# Patient Record
Sex: Male | Born: 1980 | Hispanic: Yes | Marital: Married | Smoking: Never smoker
Health system: Southern US, Community
[De-identification: ages and names within clinical notes are randomized; demographics above are authoritative.]

## PROBLEM LIST (undated history)

## (undated) DIAGNOSIS — E782 Mixed hyperlipidemia: Secondary | ICD-10-CM

## (undated) DIAGNOSIS — F5105 Insomnia due to other mental disorder: Secondary | ICD-10-CM

## (undated) DIAGNOSIS — F411 Generalized anxiety disorder: Secondary | ICD-10-CM

## (undated) DIAGNOSIS — F319 Bipolar disorder, unspecified: Secondary | ICD-10-CM

## (undated) DIAGNOSIS — R4789 Other speech disturbances: Secondary | ICD-10-CM

## (undated) HISTORY — DX: Mixed hyperlipidemia: E78.2

## (undated) HISTORY — DX: Generalized anxiety disorder: F41.1

## (undated) HISTORY — DX: Bipolar disorder, unspecified: F31.9

## (undated) HISTORY — DX: Other speech disturbances: R47.89

## (undated) HISTORY — PX: NO PAST SURGERIES: SHX2092

## (undated) HISTORY — DX: Insomnia due to other mental disorder: F51.05

---

## 2016-07-14 ENCOUNTER — Emergency Department (HOSPITAL_COMMUNITY)
Admission: EM | Admit: 2016-07-14 | Discharge: 2016-07-14 | Disposition: A | Discharge status: Home / Self Care | Payer: Self-pay | Attending: Emergency Medicine | Admitting: Emergency Medicine

## 2016-07-14 ENCOUNTER — Encounter (HOSPITAL_COMMUNITY): Payer: Self-pay | Admitting: Emergency Medicine

## 2016-07-14 DIAGNOSIS — Y929 Unspecified place or not applicable: Secondary | ICD-10-CM | POA: Insufficient documentation

## 2016-07-14 DIAGNOSIS — Y999 Unspecified external cause status: Secondary | ICD-10-CM | POA: Insufficient documentation

## 2016-07-14 DIAGNOSIS — W228XXA Striking against or struck by other objects, initial encounter: Secondary | ICD-10-CM | POA: Insufficient documentation

## 2016-07-14 DIAGNOSIS — Y9389 Activity, other specified: Secondary | ICD-10-CM | POA: Insufficient documentation

## 2016-07-14 DIAGNOSIS — IMO0002 Reserved for concepts with insufficient information to code with codable children: Secondary | ICD-10-CM

## 2016-07-14 DIAGNOSIS — W268XXA Contact with other sharp object(s), not elsewhere classified, initial encounter: Secondary | ICD-10-CM | POA: Insufficient documentation

## 2016-07-14 DIAGNOSIS — S61316A Laceration without foreign body of right little finger with damage to nail, initial encounter: Secondary | ICD-10-CM | POA: Insufficient documentation

## 2016-07-14 MED ORDER — LIDOCAINE HCL (PF) 1 % IJ SOLN: 5.0000 mL | Freq: Once | INTRAMUSCULAR | Status: DC

## 2016-07-14 MED ORDER — CEPHALEXIN 500 MG PO CAPS: 500.0000 mg | ORAL_CAPSULE | Freq: Two times a day (BID) | ORAL | 0 refills | Status: DC

## 2016-07-14 MED ORDER — LIDOCAINE HCL 1 % IJ SOLN
5.0000 mL | Freq: Once | INTRAMUSCULAR | Status: AC
  Administered 2016-07-14: 5 mL
  Filled 2016-07-14: qty 20

## 2016-07-14 NOTE — ED Triage Notes (Signed)
Patient presents with laceration to right pinky having cutting it at work. Bleeding controlled at this time.

## 2016-07-14 NOTE — ED Provider Notes (Signed)
[facility]-EMERGENCY DEPT Provider Note   CSN: [id number] Arrival date & time: 07/14/16  1310  First Provider Contact:  None    By signing my name below, I, Matthew Wilcox, attest that this documentation has been prepared under the direction and in the presence of non-physician practitioner, Audry Pili, PA-C. Electronically Signed: Freida Wilcox, Scribe. 07/14/2016. 1:48 PM.   History   Chief Complaint No chief complaint on file.   The history is provided by the patient. No language interpreter was used.   HPI Comments:  Matthew Wilcox is a 35 y.o. male who presents to the Emergency Department complaining of an injury to the tip of the right pinky finger which occurred ~ 0900 this AM. Bleeding controlled at this time. He denies pain at the site. Pt states he hit the edge of a metal mud pain injuring the finger. Tetanus is UTD within the last 2-3 months. Pt has no other complaints or symptoms at this time.   No past medical history on file.  There are no active problems to display for this patient.   No past surgical history on file.   Home Medications    Prior to Admission medications   Not on File    Family History No family history on file.  Social History Social History  Substance Use Topics  . Smoking status: Not on file  . Smokeless tobacco: Not on file  . Alcohol use Not on file    Allergies   Review of patient's allergies indicates not on file.   Review of Systems Review of Systems  Constitutional: Negative for fever.  Skin: Positive for wound.  Neurological: Negative for weakness.   Physical Exam Updated Vital Signs There were no vitals taken for this visit.  Physical Exam  Constitutional: He is oriented to person, place, and time. He appears well-developed and well-nourished. No distress.  HENT:  Head: Normocephalic and atraumatic.  Eyes: Conjunctivae are normal.  Cardiovascular: Normal rate.   Pulmonary/Chest: Effort normal.  Musculoskeletal:  Normal range of motion.  Neurological: He is alert and oriented to person, place, and time.  Skin: Skin is warm and dry. Capillary refill takes less than 2 seconds.  Right little finger: ROM intact; cap refill less than 2 sec nontender to palpation  Sensation intact. Nail avulsed from bed with matrix underneath intact. No lacerations noted. Bleeding controlled.    Psychiatric: He has a normal mood and affect.  Nursing note and vitals reviewed.  ED Treatments / Results  DIAGNOSTIC STUDIES:  COORDINATION OF CARE:  1:48 PM Discussed treatment plan with pt at bedside and pt agreed to plan.  Labs (all labs ordered are listed, but only abnormal results are displayed) Labs Reviewed - No data to display  EKG  EKG Interpretation None      Radiology No results found.  Procedures .Marland KitchenLaceration Repair Date/Time: 07/14/2016 3:18 PM Performed by: Audry Pili Authorized by: Audry Pili   Consent:    Consent obtained:  Verbal   Consent given by:  Patient Laceration details:    Location:  Finger   Finger location:  R small finger Treatment:    Area cleansed with:  Betadine   Amount of cleaning:  Standard   Irrigation solution:  Sterile saline   Irrigation method:  Syringe   Visualized foreign bodies/material removed: yes   Skin repair:    Repair method:  Tissue adhesive Approximation:    Approximation:  Close   Vermilion border: well-aligned   Post-procedure details:  Dressing:  Non-adherent dressing and antibiotic ointment   Patient tolerance of procedure:  Tolerated well, no immediate complications Comments:     Pt with nail avulsion that was re approximated. No underlying matrix damage. Sealed with Dermabond. No lacerations for suture repair. No joint involvement   .Nerve Block Date/Time: 07/14/2016 3:20 PM Performed by: Audry Pili Authorized by: Audry Pili   Consent:    Consent obtained:  Verbal   Consent given by:  Patient Location:    Body area:  Upper  extremity   Upper extremity nerve:  Metacarpal   Laterality:  Right Pre-procedure details:    Skin preparation:  2% chlorhexidine Procedure details (see MAR for exact dosages):    Block needle gauge:  24 G   Anesthetic injected:  Lidocaine 1% w/o epi   Steroid injected:  None   Additive injected:  None Post-procedure details:    Dressing:  Sterile dressing   Outcome:  Anesthesia achieved   Patient tolerance of procedure:  Tolerated well, no immediate complications   (including critical care time)  Medications Ordered in ED Medications - No data to display   Initial Impression / Assessment and Plan / ED Course  I have reviewed the triage vital signs and the nursing notes.  Pertinent labs & imaging results that were available during my care of the patient were reviewed by me and considered in my medical decision making (see chart for details).  Clinical Course   Final Clinical Impressions(s) / ED Diagnoses  I have reviewed the relevant previous healthcare records. I obtained HPI from historian.  ED Course:  Assessment: Pt is a 34yM who presents with avulsed nail from right little finger around 9am. On exam, pt in NAD. Nontoxic/nonseptic appearing. VSS. Afebrile. Nail avulsed with no laceration on matrix. Performed digit block and reinserted nail with dermabond. Dressed. Area non TTP. Full ROM. Neurovascularly intact. Plan is to DC home with follow up to PCP. Given ABX. At time of discharge, Patient is in no acute distress. Vital Signs are stable. Patient is able to ambulate. Patient able to tolerate PO.    Disposition/Plan:  DC Home Additional Verbal discharge instructions given and discussed with patient.  Pt Instructed to f/u with PCP in the next week for evaluation and treatment of symptoms. Return precautions given Pt acknowledges and agrees with plan  Supervising Physician Doug Sou, MD   Final diagnoses:  Nail avulsion    New Prescriptions New Prescriptions     No medications on file   I personally performed the services described in this documentation, which was scribed in my presence. The recorded information has been reviewed and is accurate.    Audry Pili, PA-C 07/14/16 1521    Doug Sou, MD 07/14/16 [phone]

## 2016-07-14 NOTE — Discharge Instructions (Signed)
Please read and follow all provided instructions.  Your diagnoses today include:  1. Nail avulsion     Tests performed today include: Vital signs. See below for your results today.   Medications prescribed:  Take as prescribed   Home care instructions:  Follow any educational materials contained in this packet. DO NOT USE OIL ON THE AREA. NO NEOSPORIN  Follow-up instructions: Please follow-up with your primary care provider for further evaluation of symptoms and treatment   Return instructions:  Please return to the Emergency Department if you do not get better, if you get worse, or new symptoms OR  - Fever (temperature greater than 101.68F)  - Bleeding that does not stop with holding pressure to the area    -Severe pain (please note that you may be more sore the day after your accident)  - Chest Pain  - Difficulty breathing  - Severe nausea or vomiting  - Inability to tolerate food and liquids  - Passing out  - Skin becoming red around your wounds  - Change in mental status (confusion or lethargy)  - New numbness or weakness    Please return if you have any other emergent concerns.  Additional Information:  Your vital signs today were: BP 142/84 (BP Location: Left Arm)    Pulse 87    Temp 98.1 F (36.7 C) (Oral)    Resp 18    SpO2 100%  If your blood pressure (BP) was elevated above 135/85 this visit, please have this repeated by your doctor within one month. ---------------

## 2019-05-10 ENCOUNTER — Ambulatory Visit
Admission: RE | Admit: 2019-05-10 | Discharge: 2019-05-10 | Disposition: A | Discharge status: Home / Self Care | Payer: Self-pay | Source: Ambulatory Visit | Attending: Family Medicine | Admitting: Family Medicine

## 2019-05-10 ENCOUNTER — Other Ambulatory Visit: Payer: Self-pay | Admitting: Family Medicine

## 2019-05-10 DIAGNOSIS — R410 Disorientation, unspecified: Secondary | ICD-10-CM

## 2019-05-10 DIAGNOSIS — R42 Dizziness and giddiness: Secondary | ICD-10-CM

## 2020-01-22 IMAGING — CT CT HEAD WITHOUT CONTRAST
3 of 4 series · 15 of 47 positions shown, 18 images · non-contrast
Comparison: None.

CLINICAL DATA: Dizziness and giddiness  Confusion

EXAM:
CT HEAD WITHOUT CONTRAST
TECHNIQUE: Contiguous axial images were obtained from the base of the skull
through the vertex without intravenous contrast.

[Series 2: head 5.00 hr40 s3 ibhc · axial · 0.42mm/px · z∈[-599,-464]mm · 9 of 33 slices shown, 12 images]
[im 3/33  brain]
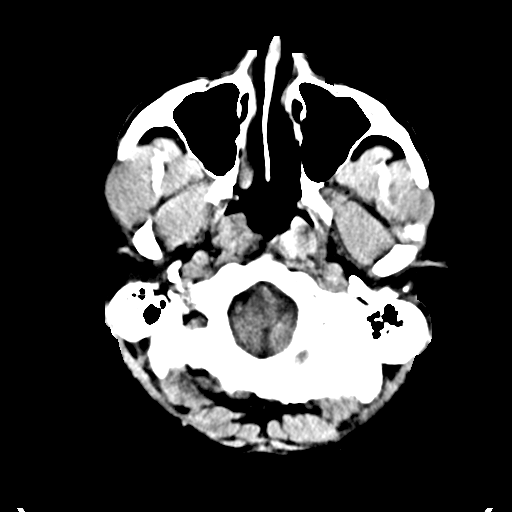
[im 3/33  bone]
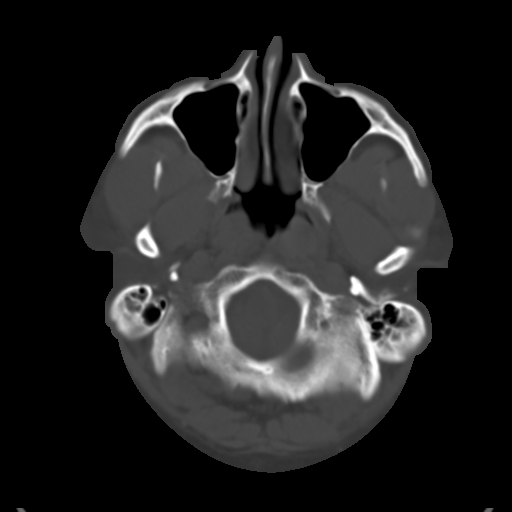
[im 7/33  brain]
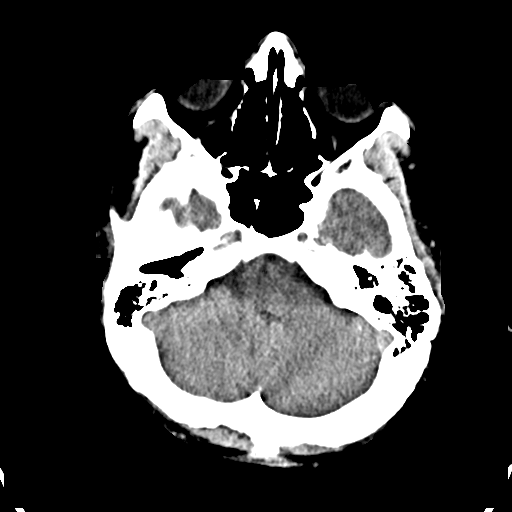
[im 10/33  brain]
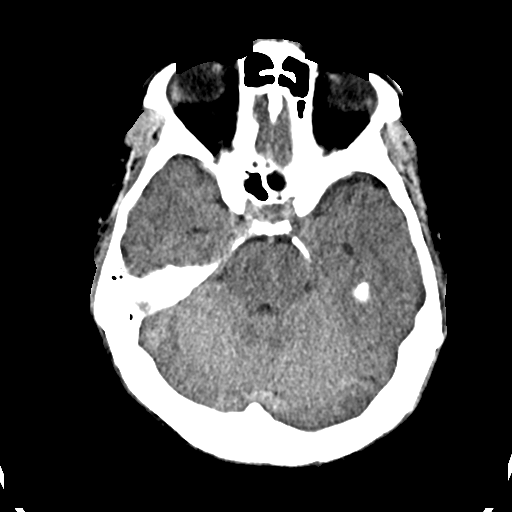
[im 14/33  brain]
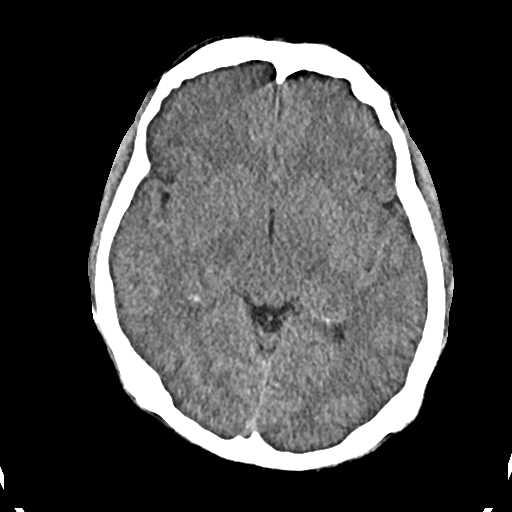
[im 17/33  brain]
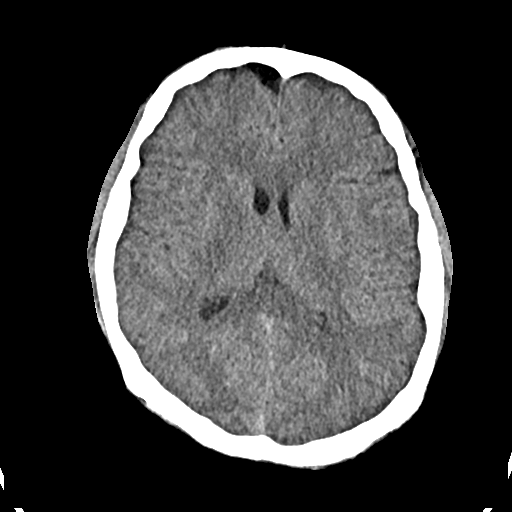
[im 17/33  bone]
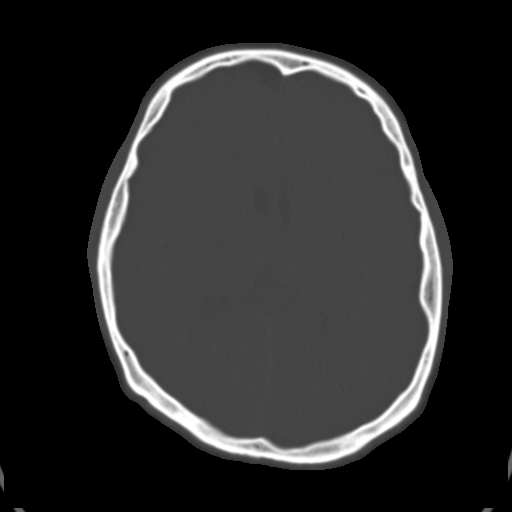
[im 19/33  brain]
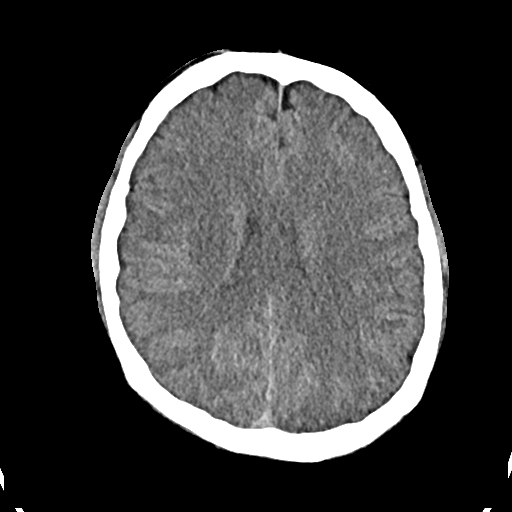
[im 23/33  brain]
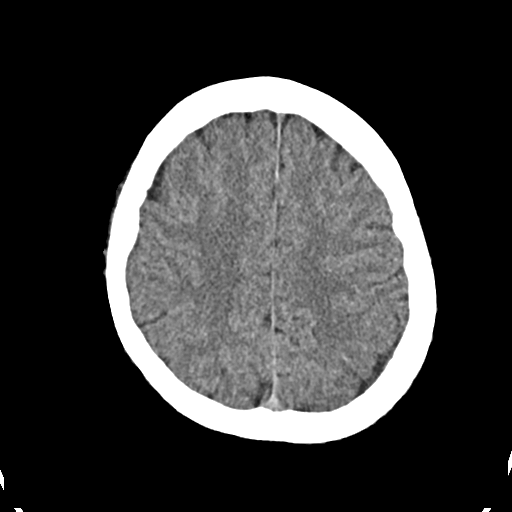
[im 26/33  brain]
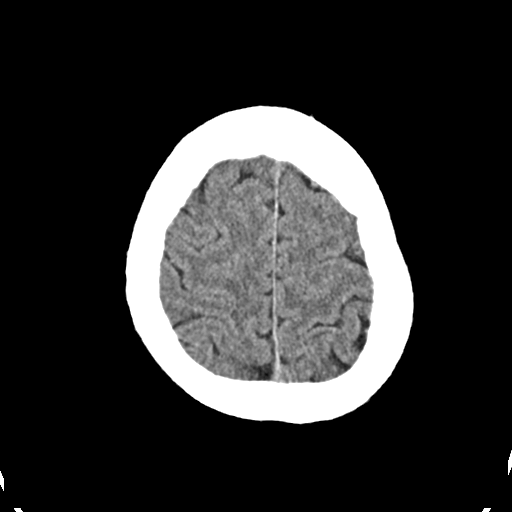
[im 30/33  brain]
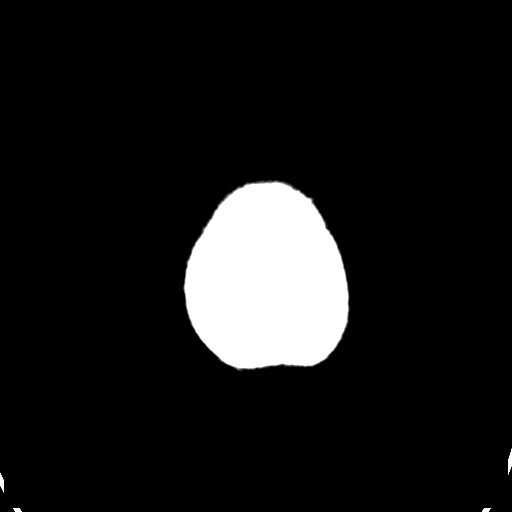
[im 30/33  bone]
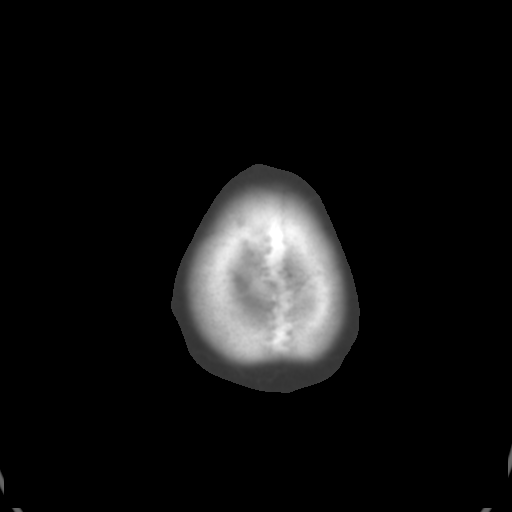

[Series 4: head 3.00 hr40 s3 sag · sagittal · 0.31mm/px · 3 of 64 slices shown]
[im 22/64  brain]
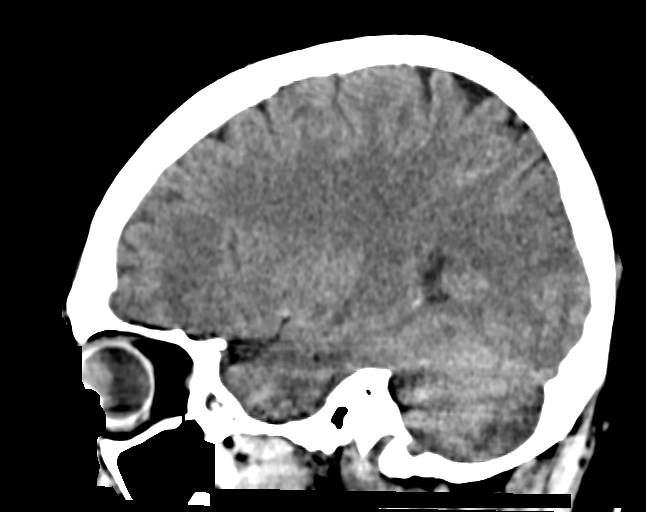
[im 32/64  brain]
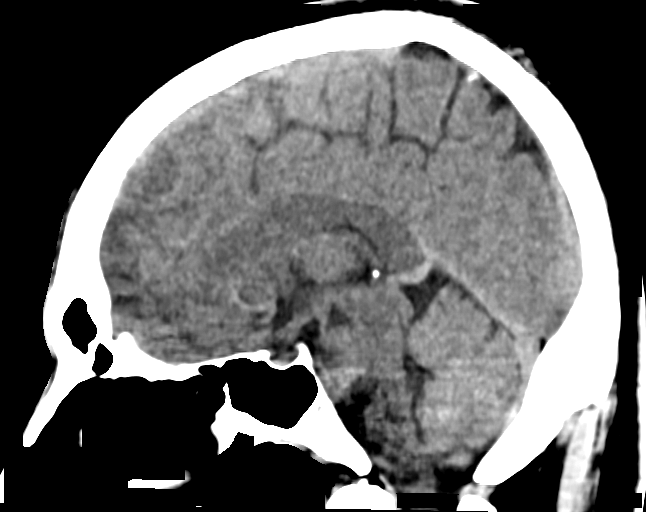
[im 43/64  brain]
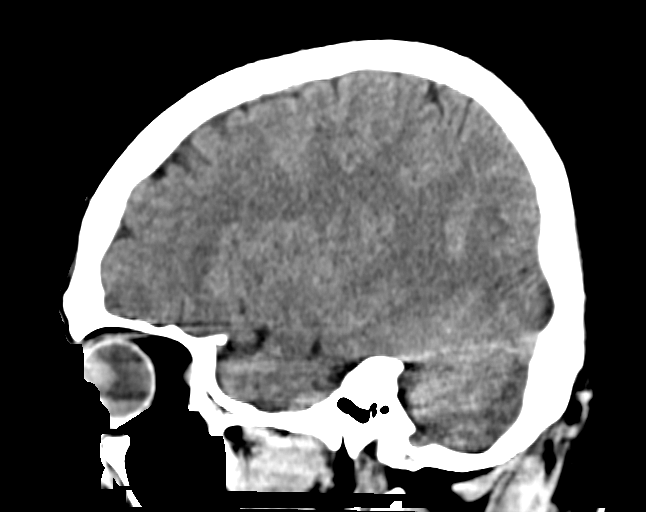

[Series 6: head 3.00 hr40 s3 cor · coronal · 0.31mm/px · 3 of 67 slices shown]
[im 23/67  brain]
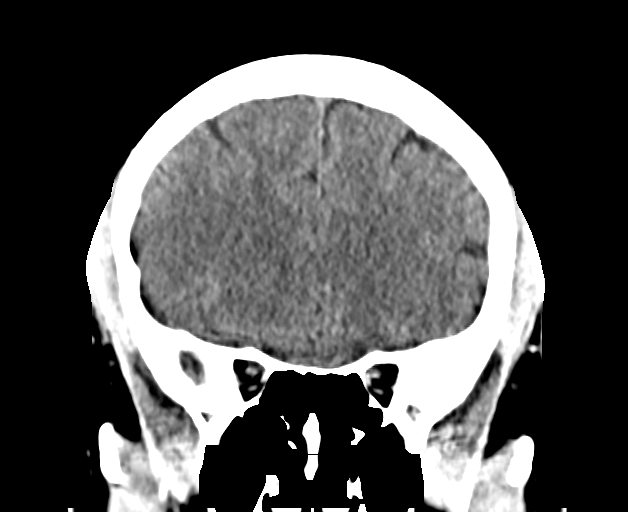
[im 30/67  brain]
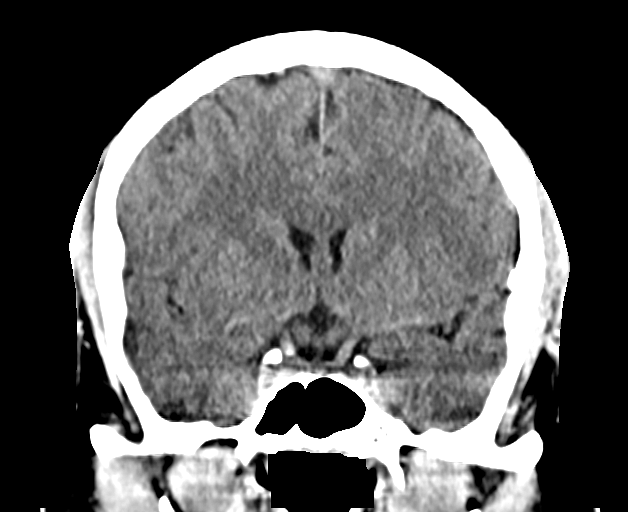
[im 37/67  brain]
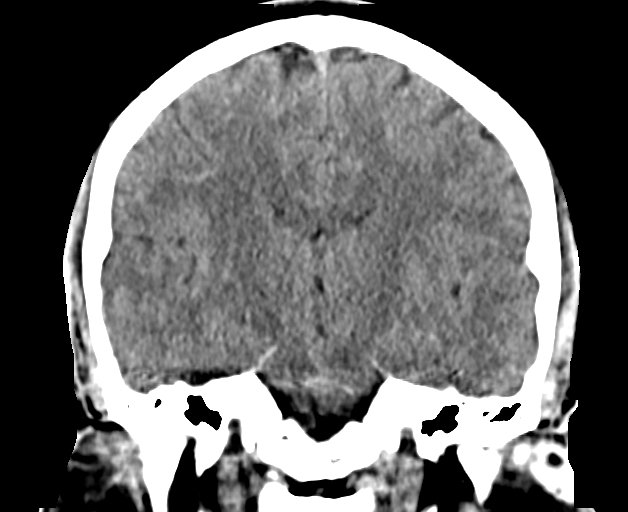

[15 of 47 positions shown; findings below may reference images not displayed]

FINDINGS: Brain: No evidence of acute infarction, hemorrhage, hydrocephalus,
extra-axial collection or mass lesion/mass effect.

Vascular: No hyperdense vessel or unexpected calcification.

Skull: No osseous abnormality.

Sinuses/Orbits: Visualized paranasal sinuses are clear. Visualized
mastoid sinuses are clear. Visualized orbits demonstrate no focal
abnormality.

Other: None
IMPRESSION: No acute intracranial pathology.

## 2020-02-23 ENCOUNTER — Other Ambulatory Visit: Payer: Self-pay

## 2020-02-24 ENCOUNTER — Other Ambulatory Visit: Payer: Self-pay | Admitting: Family Medicine

## 2020-02-24 MED ORDER — CARIPRAZINE HCL 3 MG PO CAPS: 3.0000 mg | ORAL_CAPSULE | Freq: Every day | ORAL | 1 refills | Status: DC

## 2020-02-24 MED ORDER — LORAZEPAM 0.5 MG PO TABS: 0.5000 mg | ORAL_TABLET | Freq: Every day | ORAL | 1 refills | Status: DC | PRN

## 2020-02-24 MED ORDER — LORAZEPAM 0.5 MG PO TABS: 0.5000 mg | ORAL_TABLET | Freq: Every day | ORAL | 0 refills | Status: DC | PRN

## 2020-02-24 MED ORDER — CARIPRAZINE HCL 3 MG PO CAPS: 3.0000 mg | ORAL_CAPSULE | Freq: Every day | ORAL | 11 refills | Status: DC

## 2020-02-28 ENCOUNTER — Other Ambulatory Visit: Payer: Self-pay | Admitting: Family Medicine

## 2020-02-28 MED ORDER — CARIPRAZINE HCL 3 MG PO CAPS: 3.0000 mg | ORAL_CAPSULE | Freq: Every day | ORAL | 1 refills | Status: DC

## 2020-03-05 ENCOUNTER — Telehealth: Payer: Self-pay

## 2020-03-05 NOTE — Telephone Encounter (Signed)
Matthew Wilcox called to report that Matthew Wilcox did not have a good week-end.  He was very sleepy and sluggish but has had no change in medication.  He is better today.  Matthew Wilcox is going to watch him closely and call for an appointment if he continues to feel sluggish.

## 2020-03-06 ENCOUNTER — Other Ambulatory Visit: Payer: Self-pay | Admitting: Family Medicine

## 2020-03-08 ENCOUNTER — Other Ambulatory Visit: Payer: Self-pay | Admitting: Family Medicine

## 2020-03-08 MED ORDER — LORAZEPAM 0.5 MG PO TABS: ORAL_TABLET | ORAL | 1 refills | Status: DC

## 2020-04-17 ENCOUNTER — Encounter: Payer: Self-pay | Admitting: Family Medicine

## 2020-04-17 DIAGNOSIS — E782 Mixed hyperlipidemia: Secondary | ICD-10-CM | POA: Insufficient documentation

## 2020-04-17 DIAGNOSIS — F5105 Insomnia due to other mental disorder: Secondary | ICD-10-CM | POA: Insufficient documentation

## 2020-04-17 NOTE — Progress Notes (Signed)
Subjective:  Patient ID: Matthew Wilcox, male    DOB: May 20, 1981  Age: 39 y.o. MRN: [id number]  Chief Complaint  Patient presents with  . Depression  . Hyperlipidemia  . Insomnia   HPI Patient to be evaluated for bipolar 1, major depressive disorder, recurrent, mild.  This is a routine follow-up.  The diagnosis of depression was made several years ago.  Presently, he feels a mild degree of depression.  Current medications include Vraylar 3 mg once daily. and lorazepam.  He denies any affective symptoms, including anhedonia, anxious mood, a change in appetite, altered sleep habits, crying spells, decreased ability to concentrate, fatigue, sadness and feelings of worthlessness.  Presently, Claudell denies suicidal ideation.  Previously tried paxil, prozac, lexapro, cymbalta, abilify and effexor. Wellbutrin did not help. He was able to get patient assistance for the vraylar. The weather change has helped also.     Pt presents with hyperlipidemia.  Current treatment includes Pravachol and a low cholesterol/low fat diet.  Compliance with treatment has been good; he maintains his exercise regimen.  He denies experiencing any hypercholesterolemia related symptoms.    Past Medical History:  Diagnosis Date  . Bipolar disorder ([place])   . GAD (generalized anxiety disorder)   . Insomnia due to mental disorder   . Mixed hyperlipidemia   . Other speech disturbances    No past surgical history on file.  Family History  Problem Relation Age of Onset  . Depression Mother   . Cancer Father        prostate  . Depression Father    Social History   Socioeconomic History  . Marital status: Married    Spouse name: Not on file  . Number of children: Not on file  . Years of education: Not on file  . Highest education level: Not on file  Occupational History  . Occupation: Painter  Tobacco Use  . Smoking status: Never Smoker  . Smokeless tobacco: Never Used  Substance and Sexual Activity  . Alcohol  use: Yes    Alcohol/week: 1.0 standard drinks    Types: 1 Cans of beer per week  . Drug use: No  . Sexual activity: Not on file  Other Topics Concern  . Not on file  Social History Narrative  . Not on file   Social Determinants of Health   Financial Resource Strain:   . Difficulty of Paying Living Expenses:   Food Insecurity:   . Worried About Charity fundraiser in the Last Year:   . Arboriculturist in the Last Year:   Transportation Needs:   . Film/video editor (Medical):   Marland Kitchen Lack of Transportation (Non-Medical):   Physical Activity:   . Days of Exercise per Week:   . Minutes of Exercise per Session:   Stress:   . Feeling of Stress :   Social Connections:   . Frequency of Communication with Friends and Family:   . Frequency of Social Gatherings with Friends and Family:   . Attends Religious Services:   . Active Member of Clubs or Organizations:   . Attends Archivist Meetings:   Marland Kitchen Marital Status:     Review of Systems  Constitutional: Negative for chills, diaphoresis, fatigue and fever.  HENT: Negative for congestion, ear pain and sore throat.   Respiratory: Negative for cough and shortness of breath.   Cardiovascular: Negative for chest pain and leg swelling.  Gastrointestinal: Negative for abdominal pain, constipation, diarrhea, nausea and vomiting.  Genitourinary: Negative for dysuria and urgency.  Musculoskeletal: Negative for arthralgias and myalgias.  Neurological: Negative for dizziness and headaches.  Psychiatric/Behavioral: Negative for dysphoric mood and sleep disturbance. The patient is not nervous/anxious.      Objective:  BP 110/74   Pulse 84   Temp (!) 97.1 F (36.2 C)   Resp 16   Ht 5\' 8"  (1.727 m)   Wt 189 lb 9.6 oz (86 kg)   BMI 28.83 kg/m   BP/Weight 04/19/2020 07/14/2016  Systolic BP 110 142  Diastolic BP 74 84  Wt. (Lbs) 189.6 -  BMI 28.83 -    Physical Exam Constitutional:      Appearance: Normal appearance.    Cardiovascular:     Rate and Rhythm: Normal rate and regular rhythm.  Pulmonary:     Effort: Pulmonary effort is normal.     Breath sounds: Normal breath sounds.  Abdominal:     General: Bowel sounds are normal.     Palpations: Abdomen is soft.     Tenderness: There is no abdominal tenderness.  Neurological:     Mental Status: He is alert and oriented to person, place, and time.  Psychiatric:        Mood and Affect: Mood normal.        Behavior: Behavior normal.     Assessment & Plan:  1. Mixed hyperlipidemia Improving. No changes to medicines.  Continue to work on eating a healthy diet and exercise.  Labs drawn today.  - Lipid panel - Comprehensive metabolic panel - CBC with Differential/Platelet  2. Insomnia due to mental disorder Improved.   3. Mild bipolar II disorder, most recent episode major depressive (HCC) The current medical regimen is effective;  continue present plan and medications.  Follow-up: Return in about 3 months (around 07/19/2020) for physical exam fasting.  An After Visit Summary was printed and given to the patient.  07/21/2020 [facility] [phone]

## 2020-04-19 ENCOUNTER — Other Ambulatory Visit: Payer: Self-pay

## 2020-04-19 ENCOUNTER — Encounter: Payer: Self-pay | Admitting: Family Medicine

## 2020-04-19 ENCOUNTER — Ambulatory Visit: Payer: 59 | Admitting: Family Medicine

## 2020-04-19 VITALS — BP 110/74 | HR 84 | Temp 97.1°F | Resp 16 | Ht 68.0 in | Wt 189.6 lb

## 2020-04-19 DIAGNOSIS — F3181 Bipolar II disorder: Secondary | ICD-10-CM | POA: Diagnosis not present

## 2020-04-19 DIAGNOSIS — F5105 Insomnia due to other mental disorder: Secondary | ICD-10-CM

## 2020-04-19 DIAGNOSIS — E782 Mixed hyperlipidemia: Secondary | ICD-10-CM

## 2020-04-20 LAB — LIPID PANEL
Chol/HDL Ratio: 4.9 ratio (ref 0.0–5.0)
Cholesterol, Total: 172 mg/dL (ref 100–199)
HDL: 35 mg/dL — ABNORMAL LOW (ref >39)
LDL Chol Calc (NIH): 91 mg/dL (ref 0–99)
Triglycerides: 273 mg/dL — ABNORMAL HIGH (ref 0–149)
VLDL Cholesterol Cal: 46 mg/dL — ABNORMAL HIGH (ref 5–40)

## 2020-04-20 LAB — COMPREHENSIVE METABOLIC PANEL
ALT: 36 IU/L (ref 0–44)
AST: 25 IU/L (ref 0–40)
Albumin/Globulin Ratio: 2.2 (ref 1.2–2.2)
Albumin: 4.8 g/dL (ref 4.0–5.0)
Alkaline Phosphatase: 84 IU/L (ref 39–117)
BUN/Creatinine Ratio: 11 (ref 9–20)
BUN: 11 mg/dL (ref 6–20)
Bilirubin Total: 0.8 mg/dL (ref 0.0–1.2)
CO2: 21 mmol/L (ref 20–29)
Calcium: 9.7 mg/dL (ref 8.7–10.2)
Chloride: 105 mmol/L (ref 96–106)
Creatinine, Ser: 0.96 mg/dL (ref 0.76–1.27)
GFR calc Af Amer: 116 mL/min/{1.73_m2} (ref >59)
GFR calc non Af Amer: 101 mL/min/{1.73_m2} (ref >59)
Globulin, Total: 2.2 g/dL (ref 1.5–4.5)
Glucose: 92 mg/dL (ref 65–99)
Potassium: 4.1 mmol/L (ref 3.5–5.2)
Sodium: 143 mmol/L (ref 134–144)
Total Protein: 7 g/dL (ref 6.0–8.5)

## 2020-04-20 LAB — CBC WITH DIFFERENTIAL/PLATELET
Basophils Absolute: 0 10*3/uL (ref 0.0–0.2)
Basos: 0 %
EOS (ABSOLUTE): 0.1 10*3/uL (ref 0.0–0.4)
Eos: 2 %
Hematocrit: 44.3 % (ref 37.5–51.0)
Hemoglobin: 15.2 g/dL (ref 13.0–17.7)
Immature Grans (Abs): 0 10*3/uL (ref 0.0–0.1)
Immature Granulocytes: 0 %
Lymphocytes Absolute: 2 10*3/uL (ref 0.7–3.1)
Lymphs: 38 %
MCH: 30 pg (ref 26.6–33.0)
MCHC: 34.3 g/dL (ref 31.5–35.7)
MCV: 88 fL (ref 79–97)
Monocytes Absolute: 0.3 10*3/uL (ref 0.1–0.9)
Monocytes: 6 %
Neutrophils Absolute: 3 10*3/uL (ref 1.4–7.0)
Neutrophils: 54 %
Platelets: 229 10*3/uL (ref 150–450)
RBC: 5.06 x10E6/uL (ref 4.14–5.80)
RDW: 13.1 % (ref 11.6–15.4)
WBC: 5.4 10*3/uL (ref 3.4–10.8)

## 2020-04-20 LAB — CARDIOVASCULAR RISK ASSESSMENT

## 2020-04-22 ENCOUNTER — Encounter: Payer: Self-pay | Admitting: Family Medicine

## 2020-04-30 ENCOUNTER — Other Ambulatory Visit: Payer: Self-pay | Admitting: Physician Assistant

## 2020-04-30 DIAGNOSIS — E782 Mixed hyperlipidemia: Secondary | ICD-10-CM

## 2020-04-30 MED ORDER — PRAVASTATIN SODIUM 40 MG PO TABS: 40.0000 mg | ORAL_TABLET | Freq: Every day | ORAL | 5 refills | Status: DC

## 2020-05-15 ENCOUNTER — Other Ambulatory Visit: Payer: Self-pay

## 2020-05-25 ENCOUNTER — Other Ambulatory Visit: Payer: Self-pay

## 2020-05-25 MED ORDER — CARIPRAZINE HCL 3 MG PO CAPS: 3.0000 mg | ORAL_CAPSULE | Freq: Every day | ORAL | 1 refills | Status: DC

## 2020-06-28 ENCOUNTER — Other Ambulatory Visit: Payer: Self-pay

## 2020-06-28 DIAGNOSIS — F3181 Bipolar II disorder: Secondary | ICD-10-CM

## 2020-07-12 ENCOUNTER — Other Ambulatory Visit: Payer: Self-pay | Admitting: Family Medicine

## 2020-07-12 DIAGNOSIS — F3181 Bipolar II disorder: Secondary | ICD-10-CM

## 2020-07-24 NOTE — Patient Instructions (Signed)
Preventive Care 38-38 Years Old, Male Preventive care refers to lifestyle choices and visits with your health care provider that can promote health and wellness. This includes:  A yearly physical exam. This is also called an annual well check.  Regular dental and eye exams.  Immunizations.  Screening for certain conditions.  Healthy lifestyle choices, such as eating a healthy diet, getting regular exercise, not using drugs or products that contain nicotine and tobacco, and limiting alcohol use. What can I expect for my preventive care visit? Physical exam Your health care provider will check:  Height and weight. These may be used to calculate body mass index (BMI), which is a measurement that tells if you are at a healthy weight.  Heart rate and blood pressure.  Your skin for abnormal spots. Counseling Your health care provider may ask you questions about:  Alcohol, tobacco, and drug use.  Emotional well-being.  Home and relationship well-being.  Sexual activity.  Eating habits.  Work and work Statistician. What immunizations do I need?  Influenza (flu) vaccine  This is recommended every year. Tetanus, diphtheria, and pertussis (Tdap) vaccine  You may need a Td booster every 10 years. Varicella (chickenpox) vaccine  You may need this vaccine if you have not already been vaccinated. Human papillomavirus (HPV) vaccine  If recommended by your health care provider, you may need three doses over 6 months. Measles, mumps, and rubella (MMR) vaccine  You may need at least one dose of MMR. You may also need a second dose. Meningococcal conjugate (MenACWY) vaccine  One dose is recommended if you are 38-13 years old and a Market researcher living in a residence hall, or if you have one of several medical conditions. You may also need additional booster doses. Pneumococcal conjugate (PCV13) vaccine  You may need this if you have certain conditions and were not  previously vaccinated. Pneumococcal polysaccharide (PPSV23) vaccine  You may need one or two doses if you smoke cigarettes or if you have certain conditions. Hepatitis A vaccine  You may need this if you have certain conditions or if you travel or work in places where you may be exposed to hepatitis A. Hepatitis B vaccine  You may need this if you have certain conditions or if you travel or work in places where you may be exposed to hepatitis B. Haemophilus influenzae type b (Hib) vaccine  You may need this if you have certain risk factors. You may receive vaccines as individual doses or as more than one vaccine together in one shot (combination vaccines). Talk with your health care provider about the risks and benefits of combination vaccines. What tests do I need? Blood tests  Lipid and cholesterol levels. These may be checked every 5 years starting at age 38.  Hepatitis C test.  Hepatitis B test. Screening   Diabetes screening. This is done by checking your blood sugar (glucose) after you have not eaten for a while (fasting).  Sexually transmitted disease (STD) testing. Talk with your health care provider about your test results, treatment options, and if necessary, the need for more tests. Follow these instructions at home: Eating and drinking   Eat a diet that includes fresh fruits and vegetables, whole grains, lean protein, and low-fat dairy products.  Take vitamin and mineral supplements as recommended by your health care provider.  Do not drink alcohol if your health care provider tells you not to drink.  If you drink alcohol: ? Limit how much you have to 38-2  drinks a day. ? Be aware of how much alcohol is in your drink. In the [place] one drink equals one 12 oz bottle of beer (355 mL), one 5 oz glass of wine (148 mL), or one 1 oz glass of hard liquor (44 mL). Lifestyle  Take daily care of your teeth and gums.  Stay active. Exercise for at least 30 minutes on 5 or  more days each week.  Do not use any products that contain nicotine or tobacco, such as cigarettes, e-cigarettes, and chewing tobacco. If you need help quitting, ask your health care provider.  If you are sexually active, practice safe sex. Use a condom or other form of protection to prevent STIs (sexually transmitted infections). What's next?  Go to your health care provider once a year for a well check visit.  Ask your health care provider how often you should have your eyes and teeth checked.  Stay up to date on all vaccines. This information is not intended to replace advice given to you by your health care provider. Make sure you discuss any questions you have with your health care provider. Document Revised: 12/02/2018 Document Reviewed: 12/02/2018 Elsevier Patient Education  2020 [employer].

## 2020-07-24 NOTE — Progress Notes (Signed)
No show

## 2020-07-25 ENCOUNTER — Ambulatory Visit (INDEPENDENT_AMBULATORY_CARE_PROVIDER_SITE_OTHER): Payer: 59 | Admitting: Family Medicine

## 2020-07-25 DIAGNOSIS — Z0000 Encounter for general adult medical examination without abnormal findings: Secondary | ICD-10-CM

## 2020-11-05 ENCOUNTER — Other Ambulatory Visit: Payer: Self-pay

## 2020-11-05 MED ORDER — CARIPRAZINE HCL 3 MG PO CAPS: 3.0000 mg | ORAL_CAPSULE | Freq: Every day | ORAL | 1 refills | Status: DC

## 2020-11-13 ENCOUNTER — Other Ambulatory Visit: Payer: Self-pay

## 2020-11-13 MED ORDER — CARIPRAZINE HCL 3 MG PO CAPS: 3.0000 mg | ORAL_CAPSULE | Freq: Every day | ORAL | 1 refills | Status: DC

## 2020-12-03 ENCOUNTER — Other Ambulatory Visit: Payer: Self-pay | Admitting: Physician Assistant

## 2020-12-03 DIAGNOSIS — E782 Mixed hyperlipidemia: Secondary | ICD-10-CM

## 2020-12-03 NOTE — Telephone Encounter (Signed)
Patient needs an appointment for refill. 

## 2020-12-31 ENCOUNTER — Other Ambulatory Visit: Payer: Self-pay | Admitting: Family Medicine

## 2020-12-31 MED ORDER — CARIPRAZINE HCL 3 MG PO CAPS: 3.0000 mg | ORAL_CAPSULE | Freq: Every day | ORAL | 0 refills | Status: DC

## 2021-01-11 ENCOUNTER — Other Ambulatory Visit: Payer: Self-pay

## 2021-01-11 MED ORDER — CARIPRAZINE HCL 3 MG PO CAPS: 3.0000 mg | ORAL_CAPSULE | Freq: Every day | ORAL | 0 refills | Status: DC

## 2021-03-18 ENCOUNTER — Other Ambulatory Visit: Payer: Self-pay | Admitting: Family Medicine

## 2021-03-18 DIAGNOSIS — E782 Mixed hyperlipidemia: Secondary | ICD-10-CM

## 2021-05-20 NOTE — Progress Notes (Signed)
Acute Office Visit  Subjective:    Patient ID: Matthew Wilcox, male    DOB: [birth year], 40 y.o.   MRN: [id number]  Chief Complaint  Patient presents with  . Nail Problem    HPI Patient is in today for toenail fungus which is affecting all toenails on both feet. Has had treatment in the past. States this has been going on for approx 1 year.  Pt has hyperlipidemia: on pravastatin. Is not fasting this morning.  Bipolar, Depression: on medications. Well controlle.d   Past Medical History:  Diagnosis Date  . Bipolar disorder (HCC)   . GAD (generalized anxiety disorder)   . Insomnia due to mental disorder   . Mixed hyperlipidemia   . Other speech disturbances     No past surgical history on file.  Family History  Problem Relation Age of Onset  . Depression Mother   . Cancer Father        prostate  . Depression Father     Social History   Socioeconomic History  . Marital status: Married    Spouse name: Not on file  . Number of children: Not on file  . Years of education: Not on file  . Highest education level: Not on file  Occupational History  . Occupation: Painter  Tobacco Use  . Smoking status: Never Smoker  . Smokeless tobacco: Never Used  Substance and Sexual Activity  . Alcohol use: Yes    Alcohol/week: 1.0 standard drink    Types: 1 Cans of beer per week  . Drug use: No  . Sexual activity: Not on file  Other Topics Concern  . Not on file  Social History Narrative  . Not on file   Social Determinants of Health   Financial Resource Strain: Not on file  Food Insecurity: Not on file  Transportation Needs: Not on file  Physical Activity: Not on file  Stress: Not on file  Social Connections: Not on file  Intimate Partner Violence: Not on file    Outpatient Medications Prior to Visit  Medication Sig Dispense Refill  . busPIRone (BUSPAR) 15 MG tablet Take 1 tablet by mouth twice daily 60 tablet 2  . cariprazine (VRAYLAR) capsule Take 1 capsule (3  mg total) by mouth daily. 90 capsule 0  . LORazepam (ATIVAN) 0.5 MG tablet TAKE 1 TABLET BY MOUTH ONCE DAILY AS NEEDED FOR ANXIETY /PANIC  ATTACK 30 tablet 1  . Omega-3 Fatty Acids (FISH OIL) 1000 MG CPDR Take 2,000 mg by mouth daily.    . pravastatin (PRAVACHOL) 40 MG tablet Take 1 tablet by mouth once daily 90 tablet 0   No facility-administered medications prior to visit.    Allergies  Allergen Reactions  . Abilify [Aripiprazole] Other (See Comments)    Weight gain     Review of Systems  Constitutional: Negative for chills, fatigue, fever and unexpected weight change.  HENT: Negative for congestion, ear pain, sinus pain and sore throat.   Cardiovascular: Negative for chest pain and palpitations.  Gastrointestinal: Negative for abdominal pain, blood in stool, constipation, diarrhea, nausea and vomiting.  Endocrine: Negative for polydipsia.  Genitourinary: Negative for dysuria.  Musculoskeletal: Positive for back pain.  Skin: Negative for rash.  Neurological: Negative for headaches.  Psychiatric/Behavioral: Negative for dysphoric mood. The patient is not nervous/anxious.        Objective:    Physical Exam Constitutional:      Appearance: Normal appearance.  Cardiovascular:     Rate and Rhythm: Normal  rate and regular rhythm.     Heart sounds: Normal heart sounds.  Pulmonary:     Effort: Pulmonary effort is normal.     Breath sounds: Normal breath sounds.  Abdominal:     General: Bowel sounds are normal.     Palpations: Abdomen is soft. There is no mass.     Tenderness: There is no abdominal tenderness.  Skin:    Comments: onychomysosis BL Gret toes.   Neurological:     Mental Status: He is alert.  Psychiatric:        Mood and Affect: Mood normal.        Behavior: Behavior normal.        Thought Content: Thought content normal.     BP 108/72   Pulse 94   Temp (!) 96.5 F (35.8 C)   Ht 5\' 8"  (1.727 m)   Wt 191 lb (86.6 kg)   SpO2 99%   BMI 29.04 kg/m  Wt  Readings from Last 3 Encounters:  05/21/21 191 lb (86.6 kg)  04/19/20 189 lb 9.6 oz (86 kg)    Health Maintenance Due  Topic Date Due  . HIV Screening  Never done  . Hepatitis C Screening  Never done  . TETANUS/TDAP  Never done    There are no preventive care reminders to display for this patient.   No results found for: TSH Lab Results  Component Value Date   WBC 5.4 04/19/2020   HGB 15.2 04/19/2020   HCT 44.3 04/19/2020   MCV 88 04/19/2020   PLT 229 04/19/2020   Lab Results  Component Value Date   NA 143 04/19/2020   K 4.1 04/19/2020   CO2 21 04/19/2020   GLUCOSE 92 04/19/2020   BUN 11 04/19/2020   CREATININE 0.96 04/19/2020   BILITOT 0.8 04/19/2020   ALKPHOS 84 04/19/2020   AST 25 04/19/2020   ALT 36 04/19/2020   PROT 7.0 04/19/2020   ALBUMIN 4.8 04/19/2020   CALCIUM 9.7 04/19/2020   Lab Results  Component Value Date   CHOL 172 04/19/2020   Lab Results  Component Value Date   HDL 35 (L) 04/19/2020   Lab Results  Component Value Date   LDLCALC 91 04/19/2020   Lab Results  Component Value Date   TRIG 273 (H) 04/19/2020   Lab Results  Component Value Date   CHOLHDL 4.9 04/19/2020   No results found for: HGBA1C     Assessment & Plan:  1. Mixed hyperlipidemia - Comprehensive metabolic panel  2. Onychomycosis Start on terbinafine 250 mg once daily.  - Comprehensive metabolic panel - terbinafine (LAMISIL) 250 MG tablet; Take 1 tablet (250 mg total) by mouth daily.  Dispense: 90 tablet; Refill: 0  3. Mild bipolar II disorder, most recent episode major depressive (HCC)  The current medical regimen is effective;  continue present plan and medications.   Meds ordered this encounter  Medications  . terbinafine (LAMISIL) 250 MG tablet    Sig: Take 1 tablet (250 mg total) by mouth daily.    Dispense:  90 tablet    Refill:  0    Orders Placed This Encounter  Procedures  . Comprehensive metabolic panel  . Lipid panel   Follow-up: Return in  about 1 week (around 05/28/2021) for for lab draw  for Cholesterol panel. .  An After Visit Summary was printed and given to the patient.  07/28/2021, MD [facility] [phone]

## 2021-05-21 ENCOUNTER — Encounter: Payer: Self-pay | Admitting: Family Medicine

## 2021-05-21 ENCOUNTER — Ambulatory Visit (INDEPENDENT_AMBULATORY_CARE_PROVIDER_SITE_OTHER): Payer: 59 | Admitting: Family Medicine

## 2021-05-21 ENCOUNTER — Other Ambulatory Visit: Payer: Self-pay

## 2021-05-21 VITALS — BP 108/72 | HR 94 | Temp 96.5°F | Ht 68.0 in | Wt 191.0 lb

## 2021-05-21 DIAGNOSIS — E782 Mixed hyperlipidemia: Secondary | ICD-10-CM | POA: Diagnosis not present

## 2021-05-21 DIAGNOSIS — B351 Tinea unguium: Secondary | ICD-10-CM

## 2021-05-21 DIAGNOSIS — F3181 Bipolar II disorder: Secondary | ICD-10-CM | POA: Diagnosis not present

## 2021-05-21 LAB — COMPREHENSIVE METABOLIC PANEL
ALT: 38 IU/L (ref 0–44)
AST: 23 IU/L (ref 0–40)
Albumin/Globulin Ratio: 2 (ref 1.2–2.2)
Albumin: 4.5 g/dL (ref 4.0–5.0)
Alkaline Phosphatase: 133 IU/L — ABNORMAL HIGH (ref 44–121)
BUN/Creatinine Ratio: 10 (ref 9–20)
BUN: 11 mg/dL (ref 6–20)
Bilirubin Total: 0.6 mg/dL (ref 0.0–1.2)
CO2: 24 mmol/L (ref 20–29)
Calcium: 9.5 mg/dL (ref 8.7–10.2)
Chloride: 103 mmol/L (ref 96–106)
Creatinine, Ser: 1.13 mg/dL (ref 0.76–1.27)
Globulin, Total: 2.3 g/dL (ref 1.5–4.5)
Glucose: 77 mg/dL (ref 65–99)
Potassium: 4.5 mmol/L (ref 3.5–5.2)
Sodium: 143 mmol/L (ref 134–144)
Total Protein: 6.8 g/dL (ref 6.0–8.5)
eGFR: 85 mL/min/{1.73_m2} (ref >59)

## 2021-05-21 MED ORDER — TERBINAFINE HCL 250 MG PO TABS: 250.0000 mg | ORAL_TABLET | Freq: Every day | ORAL | 0 refills | Status: DC

## 2021-05-21 NOTE — Patient Instructions (Signed)
Start on terbinafine 250 mg once daily. Pt to come back next week for cholesterol check (just lab visit), then will send pravastatin.

## 2021-05-28 ENCOUNTER — Other Ambulatory Visit: Payer: Self-pay

## 2021-05-28 ENCOUNTER — Other Ambulatory Visit: Payer: 59

## 2021-05-28 DIAGNOSIS — E782 Mixed hyperlipidemia: Secondary | ICD-10-CM

## 2021-05-29 LAB — LIPID PANEL
Chol/HDL Ratio: 6.1 ratio — ABNORMAL HIGH (ref 0.0–5.0)
Cholesterol, Total: 183 mg/dL (ref 100–199)
HDL: 30 mg/dL — ABNORMAL LOW (ref >39)
LDL Chol Calc (NIH): 92 mg/dL (ref 0–99)
Triglycerides: 364 mg/dL — ABNORMAL HIGH (ref 0–149)
VLDL Cholesterol Cal: 61 mg/dL — ABNORMAL HIGH (ref 5–40)

## 2021-05-29 LAB — CARDIOVASCULAR RISK ASSESSMENT

## 2021-06-04 ENCOUNTER — Other Ambulatory Visit: Payer: Self-pay

## 2021-06-04 MED ORDER — OMEGA-3-ACID ETHYL ESTERS 1 G PO CAPS: 2.0000 g | ORAL_CAPSULE | Freq: Two times a day (BID) | ORAL | 2 refills | Status: DC

## 2021-06-17 ENCOUNTER — Other Ambulatory Visit: Payer: Self-pay | Admitting: Physician Assistant

## 2021-06-17 DIAGNOSIS — E782 Mixed hyperlipidemia: Secondary | ICD-10-CM

## 2021-08-26 ENCOUNTER — Other Ambulatory Visit: Payer: Self-pay | Admitting: Family Medicine

## 2021-08-26 DIAGNOSIS — B351 Tinea unguium: Secondary | ICD-10-CM

## 2021-08-28 ENCOUNTER — Other Ambulatory Visit: Payer: Self-pay | Admitting: Family Medicine

## 2021-08-28 DIAGNOSIS — B351 Tinea unguium: Secondary | ICD-10-CM

## 2021-09-06 ENCOUNTER — Ambulatory Visit: Payer: 59 | Admitting: Family Medicine

## 2021-09-09 ENCOUNTER — Ambulatory Visit: Payer: 59 | Admitting: Family Medicine

## 2021-09-09 ENCOUNTER — Encounter: Payer: Self-pay | Admitting: Family Medicine

## 2021-09-09 ENCOUNTER — Other Ambulatory Visit: Payer: Self-pay

## 2021-09-09 VITALS — BP 114/68 | HR 82 | Temp 97.2°F | Ht 68.0 in | Wt 185.0 lb

## 2021-09-09 DIAGNOSIS — S93492A Sprain of other ligament of left ankle, initial encounter: Secondary | ICD-10-CM

## 2021-09-09 DIAGNOSIS — Z23 Encounter for immunization: Secondary | ICD-10-CM | POA: Diagnosis not present

## 2021-09-09 DIAGNOSIS — M25369 Other instability, unspecified knee: Secondary | ICD-10-CM

## 2021-09-09 MED ORDER — IBUPROFEN 800 MG PO TABS: 800.0000 mg | ORAL_TABLET | Freq: Three times a day (TID) | ORAL | 0 refills | Status: DC | PRN

## 2021-09-09 NOTE — Patient Instructions (Signed)
Take ibuprofen 800 mg one two - three times  day with food x 1 -2 weeks.  Recommend neoprene brace fore knees.  Recommend ankle stability brace.

## 2021-09-09 NOTE — Progress Notes (Signed)
Acute Office Visit  Subjective:    Patient ID: Matthew Wilcox, male    DOB: May 17, 1981, 40 y.o.   MRN: [id number]  Chief Complaint  Patient presents with   Ankle Pain    HPI Patient is in today for left ankle pain states he played basketball years ago and injured his ankle while playing. Reports his ankle started hurting 1 month ago. Denies having recently injured his ankle c/o throbbing pain at times especially if he has been on his feet all day. Does not wear a brace or any exercises. Typically does not take anything for pain but occassionally will take tylenol or ibuprofen. Pt has also had some issues with his knees feeling unstable.   Past Medical History:  Diagnosis Date   Bipolar disorder ([place])    GAD (generalized anxiety disorder)    Insomnia due to mental disorder    Mixed hyperlipidemia    Other speech disturbances     History reviewed. No pertinent surgical history.  Family History  Problem Relation Age of Onset   Depression Mother    Cancer Father        prostate   Depression Father     Social History   Socioeconomic History   Marital status: Married    Spouse name: Not on file   Number of children: Not on file   Years of education: Not on file   Highest education level: Not on file  Occupational History   Occupation: Curator  Tobacco Use   Smoking status: Never   Smokeless tobacco: Never  Substance and Sexual Activity   Alcohol use: Yes    Alcohol/week: 1.0 standard drink    Types: 1 Cans of beer per week   Drug use: No   Sexual activity: Not on file  Other Topics Concern   Not on file  Social History Narrative   Not on file   Social Determinants of Health   Financial Resource Strain: Not on file  Food Insecurity: Not on file  Transportation Needs: Not on file  Physical Activity: Not on file  Stress: Not on file  Social Connections: Not on file  Intimate Partner Violence: Not on file    Outpatient Medications Prior to Visit   Medication Sig Dispense Refill   busPIRone (BUSPAR) 15 MG tablet Take 1 tablet by mouth twice daily 60 tablet 2   cariprazine (VRAYLAR) capsule Take 1 capsule (3 mg total) by mouth daily. 90 capsule 0   LORazepam (ATIVAN) 0.5 MG tablet TAKE 1 TABLET BY MOUTH ONCE DAILY AS NEEDED FOR ANXIETY /PANIC  ATTACK 30 tablet 1   pravastatin (PRAVACHOL) 40 MG tablet Take 1 tablet by mouth once daily 90 tablet 0   terbinafine (LAMISIL) 250 MG tablet Take 1 tablet by mouth once daily 90 tablet 0   omega-3 acid ethyl esters (LOVAZA) 1 g capsule Take 2 capsules (2 g total) by mouth 2 (two) times daily. 120 capsule 2   No facility-administered medications prior to visit.    Allergies  Allergen Reactions   Abilify [Aripiprazole] Other (See Comments)    Weight gain     Review of Systems  Constitutional:  Negative for chills, diaphoresis, fatigue and fever.  HENT:  Negative for congestion, ear pain and sore throat.   Respiratory:  Negative for cough and shortness of breath.   Cardiovascular:  Negative for chest pain and leg swelling.  Gastrointestinal:  Negative for abdominal pain, constipation, diarrhea, nausea and vomiting.  Musculoskeletal:  Positive  for arthralgias (Ankle pain (left)). Negative for myalgias.      Objective:    Physical Exam Vitals reviewed.  Constitutional:      Appearance: Normal appearance.  Musculoskeletal:        General: Tenderness (left ankle. mild edema. Nontender over malleoli.) present.     Comments: BL knee FROM. Negative ligament laxity. Nontender.   Neurological:     Mental Status: He is alert.    BP 114/68   Pulse 82   Temp (!) 97.2 F (36.2 C)   Ht _0  (1.727 m)   Wt 185 lb (83.9 kg)   SpO2 99%   BMI 28.13 kg/m  Wt Readings from Last 3 Encounters:  09/09/21 185 lb (83.9 kg)  05/21/21 191 lb (86.6 kg)  04/19/20 189 lb 9.6 oz (86 kg)    Health Maintenance Due  Topic Date Due   HIV Screening  Never done   Hepatitis C Screening  Never done    TETANUS/TDAP  Never done    There are no preventive care reminders to display for this patient.   No results found for: TSH Lab Results  Component Value Date   WBC 5.4 04/19/2020   HGB 15.2 04/19/2020   HCT 44.3 04/19/2020   MCV 88 04/19/2020   PLT 229 04/19/2020   Lab Results  Component Value Date   NA 143 05/21/2021   K 4.5 05/21/2021   CO2 24 05/21/2021   GLUCOSE 77 05/21/2021   BUN 11 05/21/2021   CREATININE 1.13 05/21/2021   BILITOT 0.6 05/21/2021   ALKPHOS 133 (H) 05/21/2021   AST 23 05/21/2021   ALT 38 05/21/2021   PROT 6.8 05/21/2021   ALBUMIN 4.5 05/21/2021   CALCIUM 9.5 05/21/2021   EGFR 85 05/21/2021   Lab Results  Component Value Date   CHOL 183 05/28/2021   Lab Results  Component Value Date   HDL 30 (L) 05/28/2021   Lab Results  Component Value Date   LDLCALC 92 05/28/2021   Lab Results  Component Value Date   TRIG 364 (H) 05/28/2021   Lab Results  Component Value Date   CHOLHDL 6.1 (H) 05/28/2021   No results found for: HGBA1C     Assessment & Plan:   Problem List Items Addressed This Visit       Musculoskeletal and Integument   Sprain of left ankle - Primary    Recommend ibuprofen 800 mg one three times a day.  Recommend ankle brace.  Ice.  Exercises given.        Other   Knee instability, unspecified laterality    Exam is normal.  Recommend neoprene brace for knees.      Other Visit Diagnoses     Need for immunization against influenza       Relevant Orders   Flu Vaccine QUAD 28moIM (Fluarix, Fluzone & Alfiuria Quad PF) (Completed)        Follow-up: No follow-ups on file.  An After Visit Summary was printed and given to the patient.  KRochel Brome MD [facility] [phone]

## 2021-09-22 ENCOUNTER — Other Ambulatory Visit: Payer: Self-pay | Admitting: Physician Assistant

## 2021-09-22 ENCOUNTER — Other Ambulatory Visit: Payer: Self-pay | Admitting: Family Medicine

## 2021-09-22 ENCOUNTER — Encounter: Payer: Self-pay | Admitting: Family Medicine

## 2021-09-22 DIAGNOSIS — E782 Mixed hyperlipidemia: Secondary | ICD-10-CM

## 2021-09-22 DIAGNOSIS — S93402A Sprain of unspecified ligament of left ankle, initial encounter: Secondary | ICD-10-CM | POA: Insufficient documentation

## 2021-09-22 DIAGNOSIS — M25369 Other instability, unspecified knee: Secondary | ICD-10-CM | POA: Insufficient documentation

## 2021-09-22 NOTE — Assessment & Plan Note (Signed)
Exam is normal.  Recommend neoprene brace for knees.

## 2021-09-22 NOTE — Assessment & Plan Note (Addendum)
Recommend ibuprofen 800 mg one three times a day.  Recommend ankle brace.  Ice.  Exercises given.

## 2021-10-24 ENCOUNTER — Ambulatory Visit (INDEPENDENT_AMBULATORY_CARE_PROVIDER_SITE_OTHER): Payer: 59 | Admitting: Family Medicine

## 2021-10-24 ENCOUNTER — Encounter: Payer: Self-pay | Admitting: Family Medicine

## 2021-10-24 ENCOUNTER — Other Ambulatory Visit: Payer: Self-pay

## 2021-10-24 VITALS — BP 110/72 | HR 76 | Temp 97.6°F | Resp 14 | Wt 184.4 lb

## 2021-10-24 DIAGNOSIS — R0681 Apnea, not elsewhere classified: Secondary | ICD-10-CM

## 2021-10-24 DIAGNOSIS — E782 Mixed hyperlipidemia: Secondary | ICD-10-CM

## 2021-10-24 DIAGNOSIS — J309 Allergic rhinitis, unspecified: Secondary | ICD-10-CM | POA: Insufficient documentation

## 2021-10-24 DIAGNOSIS — Z23 Encounter for immunization: Secondary | ICD-10-CM | POA: Diagnosis not present

## 2021-10-24 DIAGNOSIS — Z0001 Encounter for general adult medical examination with abnormal findings: Secondary | ICD-10-CM | POA: Diagnosis not present

## 2021-10-24 DIAGNOSIS — F3181 Bipolar II disorder: Secondary | ICD-10-CM

## 2021-10-24 DIAGNOSIS — J3089 Other allergic rhinitis: Secondary | ICD-10-CM | POA: Diagnosis not present

## 2021-10-24 MED ORDER — MONTELUKAST SODIUM 10 MG PO TABS: 10.0000 mg | ORAL_TABLET | Freq: Every day | ORAL | 3 refills | Status: DC

## 2021-10-24 NOTE — Progress Notes (Addendum)
Subjective:  Patient ID: Matthew Wilcox, male    DOB: January 17, 1981  Age: 40 y.o. MRN: [id number]  Chief Complaint  Patient presents with   Annual Exam     HPI Well Adult Physical: Patient here for a comprehensive physical exam.The patient reports back and bilateral knee discomfort.  Do you take any herbs or supplements that were not prescribed by a doctor? no Are you taking calcium supplements? no Are you taking aspirin daily? no  Encounter for general adult medical examination without abnormal findings  Physical ("At Risk" items are starred): Patient's last physical exam was 1 year ago .  Patient is not afflicted from Stress Incontinence and Urge Incontinence  Patient wears a seat belt, has smoke detectors, has carbon monoxide detectors, practices appropriate gun safety, and wears sunscreen with extended sun exposure. Dental Care: biannual cleanings, brushes and flosses daily. Ophthalmology/Optometry: Annual visit.  Hearing loss: none Vision impairment: none  Safe at home: yes  Avoids fast food.   Wife is concerned about significant apnea, snoring and fatigue. Concerned he has OSA.  Flowsheet Row Office Visit from 10/24/2021 in [facility]  PHQ-2 Total Score 0               Social Hx   Social History   Socioeconomic History   Marital status: Married    Spouse name: Not on file   Number of children: Not on file   Years of education: Not on file   Highest education level: Not on file  Occupational History   Occupation: Education administrator  Tobacco Use   Smoking status: Never   Smokeless tobacco: Never  Substance and Sexual Activity   Alcohol use: Yes    Alcohol/week: 1.0 standard drink    Types: 1 Cans of beer per week   Drug use: No   Sexual activity: Not on file  Other Topics Concern   Not on file  Social History Narrative   Not on file   Social Determinants of Health   Financial Resource Strain: Low Risk    Difficulty of Paying Living Expenses: Not hard at  all  Food Insecurity: No Food Insecurity   Worried About Programme researcher, broadcasting/film/video in the Last Year: Never true   Ran Out of Food in the Last Year: Never true  Transportation Needs: No Transportation Needs   Lack of Transportation (Medical): No   Lack of Transportation (Non-Medical): No  Physical Activity: Inactive   Days of Exercise per Week: 0 days   Minutes of Exercise per Session: 0 min  Stress: No Stress Concern Present   Feeling of Stress : Only a little  Social Connections: Not on file   Past Medical History:  Diagnosis Date   Bipolar disorder (HCC)    GAD (generalized anxiety disorder)    Insomnia due to mental disorder    Mixed hyperlipidemia    Other speech disturbances    History reviewed. No pertinent surgical history.  Family History  Problem Relation Age of Onset   Depression Mother    Cancer Father        prostate   Depression Father     Review of Systems  Constitutional:  Negative for chills and fever.  HENT:  Negative for congestion, rhinorrhea and sore throat.   Respiratory:  Negative for cough and shortness of breath.   Cardiovascular:  Negative for chest pain and palpitations.  Gastrointestinal:  Negative for abdominal pain, constipation, diarrhea, nausea and vomiting.  Genitourinary:  Negative for dysuria  and urgency.  Musculoskeletal:  Positive for arthralgias (bilateral knee pain) and back pain. Negative for myalgias.  Neurological:  Negative for dizziness and headaches.  Psychiatric/Behavioral:  Negative for dysphoric mood. The patient is not nervous/anxious.     Objective:  BP 110/72   Pulse 76   Temp 97.6 F (36.4 C)   Resp 14   Wt 184 lb 6.4 oz (83.6 kg)   BMI 28.04 kg/m   BP/Weight 10/24/2021 09/09/2021 05/21/2021  Systolic BP 110 114 108  Diastolic BP 72 68 72  Wt. (Lbs) 184.4 185 191  BMI 28.04 28.13 29.04    Physical Exam Vitals reviewed.  Constitutional:      Appearance: Normal appearance.  HENT:     Right Ear: Tympanic membrane,  ear canal and external ear normal.     Left Ear: Tympanic membrane, ear canal and external ear normal.     Nose: Congestion present. No rhinorrhea.     Mouth/Throat:     Mouth: Mucous membranes are moist.     Pharynx: No oropharyngeal exudate or posterior oropharyngeal erythema.  Neck:     Vascular: No carotid bruit.  Cardiovascular:     Rate and Rhythm: Normal rate and regular rhythm.     Pulses: Normal pulses.     Heart sounds: Normal heart sounds.  Pulmonary:     Effort: Pulmonary effort is normal. No respiratory distress.     Breath sounds: Normal breath sounds. No wheezing, rhonchi or rales.  Abdominal:     General: Bowel sounds are normal.     Palpations: Abdomen is soft.     Tenderness: There is no abdominal tenderness.  Lymphadenopathy:     Cervical: No cervical adenopathy.  Neurological:     Mental Status: He is alert and oriented to person, place, and time.  Psychiatric:        Mood and Affect: Mood normal.        Behavior: Behavior normal.    Lab Results  Component Value Date   WBC 5.3 10/24/2021   HGB 15.0 10/24/2021   HCT 43.4 10/24/2021   PLT 305 10/24/2021   GLUCOSE 100 (H) 10/24/2021   CHOL 202 (H) 10/24/2021   TRIG 128 10/24/2021   HDL 51 10/24/2021   LDLCALC 128 (H) 10/24/2021   ALT 27 10/24/2021   AST 23 10/24/2021   NA 142 10/24/2021   K 4.9 10/24/2021   CL 103 10/24/2021   CREATININE 1.01 10/24/2021   BUN 12 10/24/2021   CO2 24 10/24/2021   TSH 3.600 10/24/2021      Assessment & Plan:   Problem List Items Addressed This Visit       Respiratory   Allergic rhinitis due to allergen    Started singulair 10 mg once daily.         Other   Apnea   Relevant Orders   Home sleep test   Mild bipolar II disorder, most recent episode major depressive (HCC)    Continue vraylar.      Mixed hyperlipidemia    continue Continue to work on eating a healthy diet and exercise.  Labs drawn today.        Encounter for general adult medical  examination with abnormal findings - Primary    Things to do to keep yourself healthy  - Exercise at least 30-45 minutes a day, 3-4 days a week.  - Eat a low-fat diet with lots of fruits and vegetables, up to 7-9 servings per day.  -  Seatbelts can save your life. Wear them always.  - Smoke detectors on every level of your home, check batteries every year.  - Eye Doctor - have an eye exam every 1-2 years  - Safe sex - if you may be exposed to STDs, use a condom.  - Alcohol -  If you drink, do it moderately, less than 2 drinks per day.  - Health Care Power of Attorney. Choose someone to speak for you if you are not able.  - Depression is common in our stressful world.If you're feeling down or losing interest in things you normally enjoy, please come in for a visit.  - Violence - If anyone is threatening or hurting you, please call immediately.       Relevant Medications   montelukast (SINGULAIR) 10 MG tablet   Other Relevant Orders   CBC with Differential/Platelet (Completed)   Comprehensive metabolic panel (Completed)   Lipid panel (Completed)   TSH (Completed)   Other Visit Diagnoses     Need for vaccination       Relevant Orders   Tdap vaccine greater than or equal to 7yo IM (Completed)       Body mass index is 28.04 kg/m.   This is a list of the screening recommended for you and due dates:  Health Maintenance  Topic Date Due   HIV Screening  Never done   Hepatitis C Screening: USPSTF Recommendation to screen - Ages 41-79 yo.  Never done   Tetanus Vaccine  10/25/2031   Flu Shot  Completed   Pneumococcal Vaccination  Aged Out   HPV Vaccine  Aged Out     Meds ordered this encounter  Medications   montelukast (SINGULAIR) 10 MG tablet    Sig: Take 1 tablet (10 mg total) by mouth at bedtime.    Dispense:  30 tablet    Refill:  3    Follow-up: No follow-ups on file.  An After Visit Summary was printed and given to the patient.  Blane Ohara, MD [facility] [phone]

## 2021-10-24 NOTE — Patient Instructions (Signed)
Start singulair 10 mg once daily for allergies. Keep ENT appointment.  Things to do to keep yourself healthy  - Exercise at least 30-45 minutes a day, 3-4 days a week.  - Eat a low-fat diet with lots of fruits and vegetables, up to 7-9 servings per day.  - Seatbelts can save your life. Wear them always.  - Smoke detectors on every level of your home, check batteries every year.  - Eye Doctor - have an eye exam every 1-2 years  - Safe sex - if you may be exposed to STDs, use a condom.  - Alcohol -  If you drink, do it moderately, less than 2 drinks per day.  - Health Care Power of Attorney. Choose someone to speak for you if you are not able.  - Depression is common in our stressful world.If you're feeling down or losing interest in things you normally enjoy, please come in for a visit.  - Violence - If anyone is threatening or hurting you, please call immediately.

## 2021-10-25 LAB — COMPREHENSIVE METABOLIC PANEL
ALT: 27 IU/L (ref 0–44)
AST: 23 IU/L (ref 0–40)
Albumin/Globulin Ratio: 2.4 — ABNORMAL HIGH (ref 1.2–2.2)
Albumin: 4.8 g/dL (ref 4.0–5.0)
Alkaline Phosphatase: 90 IU/L (ref 44–121)
BUN/Creatinine Ratio: 12 (ref 9–20)
BUN: 12 mg/dL (ref 6–20)
Bilirubin Total: 0.4 mg/dL (ref 0.0–1.2)
CO2: 24 mmol/L (ref 20–29)
Calcium: 9.9 mg/dL (ref 8.7–10.2)
Chloride: 103 mmol/L (ref 96–106)
Creatinine, Ser: 1.01 mg/dL (ref 0.76–1.27)
Globulin, Total: 2 g/dL (ref 1.5–4.5)
Glucose: 100 mg/dL — ABNORMAL HIGH (ref 70–99)
Potassium: 4.9 mmol/L (ref 3.5–5.2)
Sodium: 142 mmol/L (ref 134–144)
Total Protein: 6.8 g/dL (ref 6.0–8.5)
eGFR: 97 mL/min/{1.73_m2} (ref >59)

## 2021-10-25 LAB — CBC WITH DIFFERENTIAL/PLATELET
Basophils Absolute: 0 10*3/uL (ref 0.0–0.2)
Basos: 0 %
EOS (ABSOLUTE): 0 10*3/uL (ref 0.0–0.4)
Eos: 1 %
Hematocrit: 43.4 % (ref 37.5–51.0)
Hemoglobin: 15 g/dL (ref 13.0–17.7)
Immature Grans (Abs): 0 10*3/uL (ref 0.0–0.1)
Immature Granulocytes: 1 %
Lymphocytes Absolute: 2.7 10*3/uL (ref 0.7–3.1)
Lymphs: 50 %
MCH: 30.7 pg (ref 26.6–33.0)
MCHC: 34.6 g/dL (ref 31.5–35.7)
MCV: 89 fL (ref 79–97)
Monocytes Absolute: 0.3 10*3/uL (ref 0.1–0.9)
Monocytes: 6 %
Neutrophils Absolute: 2.2 10*3/uL (ref 1.4–7.0)
Neutrophils: 42 %
Platelets: 305 10*3/uL (ref 150–450)
RBC: 4.89 x10E6/uL (ref 4.14–5.80)
RDW: 12.5 % (ref 11.6–15.4)
WBC: 5.3 10*3/uL (ref 3.4–10.8)

## 2021-10-25 LAB — TSH: TSH: 3.6 u[IU]/mL (ref 0.450–4.500)

## 2021-10-25 LAB — LIPID PANEL
Chol/HDL Ratio: 4 ratio (ref 0.0–5.0)
Cholesterol, Total: 202 mg/dL — ABNORMAL HIGH (ref 100–199)
HDL: 51 mg/dL (ref >39)
LDL Chol Calc (NIH): 128 mg/dL — ABNORMAL HIGH (ref 0–99)
Triglycerides: 128 mg/dL (ref 0–149)
VLDL Cholesterol Cal: 23 mg/dL (ref 5–40)

## 2021-10-25 NOTE — Assessment & Plan Note (Signed)

## 2021-10-25 NOTE — Assessment & Plan Note (Signed)
continue Continue to work on eating a healthy diet and exercise.  Labs drawn today.

## 2021-10-25 NOTE — Assessment & Plan Note (Signed)
Started singulair 10 mg once daily.

## 2021-10-26 NOTE — Progress Notes (Signed)
Blood count normal.  Liver function normal.  Kidney function normal.  Thyroid function normal.  Cholesterol: Triglycerides greatly improved with lovaza. LDL high at 128. Recommend add zetia 10 mg once daily.

## 2021-10-26 NOTE — Assessment & Plan Note (Signed)
Continue vraylar.

## 2021-10-29 ENCOUNTER — Other Ambulatory Visit: Payer: Self-pay

## 2021-10-29 MED ORDER — EZETIMIBE 10 MG PO TABS: 10.0000 mg | ORAL_TABLET | Freq: Every day | ORAL | 0 refills | Status: DC

## 2021-10-30 DIAGNOSIS — R0681 Apnea, not elsewhere classified: Secondary | ICD-10-CM | POA: Insufficient documentation

## 2021-10-30 NOTE — Addendum Note (Signed)
Addended byBlane Ohara on: 10/30/2021 04:47 PM   Modules accepted: Orders

## 2021-11-07 ENCOUNTER — Other Ambulatory Visit: Payer: Self-pay

## 2021-11-07 ENCOUNTER — Telehealth: Payer: 59 | Admitting: Nurse Practitioner

## 2021-11-07 ENCOUNTER — Encounter: Payer: Self-pay | Admitting: Nurse Practitioner

## 2021-11-07 VITALS — Ht 68.0 in | Wt 184.0 lb

## 2021-11-07 DIAGNOSIS — U071 COVID-19: Secondary | ICD-10-CM

## 2021-11-07 MED ORDER — NIRMATRELVIR/RITONAVIR (PAXLOVID)TABLET: 3.0000 | ORAL_TABLET | Freq: Two times a day (BID) | ORAL | 0 refills | Status: DC

## 2021-11-07 NOTE — Progress Notes (Signed)
Virtual Visit via Video Note   This visit type was conducted due to national recommendations for restrictions regarding the COVID-19 Pandemic (e.g. social distancing) in an effort to limit this patient's exposure and mitigate transmission in our community.  Due to his co-morbid illnesses, this patient is at least at moderate risk for complications without adequate follow up.  This format is felt to be most appropriate for this patient at this time.  All issues noted in this document were discussed and addressed.  A limited physical exam was performed with this format.  A verbal consent was obtained for the virtual visit.   Date:  11/07/2021   ID:  Chestine Spore, DOB 11/13/1981, MRN [id number]  Patient Location: Home Provider Location: Office/Clinic  PCP:  Blane Ohara, MD   Evaluation Performed: Established patient, acute telemedicine visit  Chief Complaint:  COVID-19  History of Present Illness:    Matthew Wilcox is a 40 y.o. male with in-home exposure to COVID-19, positive COVID-19 home test and cough. Onset of symptoms was yesterday. He has not taken any treatments for symptoms. He has requested antiviral treatment.   The patient does have symptoms concerning for COVID-19 infection (fever, chills, cough, or new shortness of breath).    Past Medical History:  Diagnosis Date   Bipolar disorder (HCC)    GAD (generalized anxiety disorder)    Insomnia due to mental disorder    Mixed hyperlipidemia    Other speech disturbances     History reviewed. No pertinent surgical history.  Family History  Problem Relation Age of Onset   Depression Mother    Cancer Father        prostate   Depression Father     Social History   Socioeconomic History   Marital status: Married    Spouse name: Not on file   Number of children: Not on file   Years of education: Not on file   Highest education level: Not on file  Occupational History   Occupation: Education administrator  Tobacco Use    Smoking status: Never   Smokeless tobacco: Never  Substance and Sexual Activity   Alcohol use: Yes    Alcohol/week: 1.0 standard drink    Types: 1 Cans of beer per week   Drug use: No   Sexual activity: Not on file  Other Topics Concern   Not on file  Social History Narrative   Not on file   Social Determinants of Health   Financial Resource Strain: Low Risk    Difficulty of Paying Living Expenses: Not hard at all  Food Insecurity: No Food Insecurity   Worried About Programme researcher, broadcasting/film/video in the Last Year: Never true   Ran Out of Food in the Last Year: Never true  Transportation Needs: No Transportation Needs   Lack of Transportation (Medical): No   Lack of Transportation (Non-Medical): No  Physical Activity: Inactive   Days of Exercise per Week: 0 days   Minutes of Exercise per Session: 0 min  Stress: No Stress Concern Present   Feeling of Stress : Only a little  Social Connections: Not on file  Intimate Partner Violence: Not At Risk   Fear of Current or Ex-Partner: No   Emotionally Abused: No   Physically Abused: No   Sexually Abused: No    Outpatient Medications Prior to Visit  Medication Sig Dispense Refill   busPIRone (BUSPAR) 15 MG tablet Take 1 tablet by mouth twice daily 60 tablet 2   DULoxetine (CYMBALTA)  20 MG capsule Take 40 mg by mouth daily.     ezetimibe (ZETIA) 10 MG tablet Take 1 tablet (10 mg total) by mouth daily. 90 tablet 0   ibuprofen (ADVIL) 800 MG tablet Take 1 tablet (800 mg total) by mouth every 8 (eight) hours as needed. 90 tablet 0   lamoTRIgine (LAMICTAL) 150 MG tablet Take 150 mg by mouth at bedtime.     montelukast (SINGULAIR) 10 MG tablet Take 1 tablet (10 mg total) by mouth at bedtime. 30 tablet 3   omega-3 acid ethyl esters (LOVAZA) 1 g capsule Take 2 capsules by mouth twice daily 360 capsule 0   pravastatin (PRAVACHOL) 40 MG tablet Take 1 tablet by mouth once daily 90 tablet 0   terbinafine (LAMISIL) 250 MG tablet Take 1 tablet by mouth  once daily 90 tablet 0   No facility-administered medications prior to visit.    Allergies:   Abilify [aripiprazole]   Social History   Tobacco Use   Smoking status: Never   Smokeless tobacco: Never  Substance Use Topics   Alcohol use: Yes    Alcohol/week: 1.0 standard drink    Types: 1 Cans of beer per week   Drug use: No     Review of Systems  Constitutional:  Positive for malaise/fatigue. Negative for chills and fever.  HENT: Negative.    Eyes: Negative.   Respiratory:  Positive for cough. Negative for shortness of breath.   Cardiovascular:  Negative for chest pain.  Gastrointestinal: Negative.   Genitourinary: Negative.   Musculoskeletal: Negative.   Skin: Negative.   Neurological: Negative.   Endo/Heme/Allergies: Negative.   Psychiatric/Behavioral: Negative.      Labs/Other Tests and Data Reviewed:    Recent Labs: 10/24/2021: ALT 27; BUN 12; Creatinine, Ser 1.01; Hemoglobin 15.0; Platelets 305; Potassium 4.9; Sodium 142; TSH 3.600   Recent Lipid Panel Lab Results  Component Value Date/Time   CHOL 202 (H) 10/24/2021 09:56 AM   TRIG 128 10/24/2021 09:56 AM   HDL 51 10/24/2021 09:56 AM   CHOLHDL 4.0 10/24/2021 09:56 AM   LDLCALC 128 (H) 10/24/2021 09:56 AM    Wt Readings from Last 3 Encounters:  11/07/21 184 lb (83.5 kg)  10/27/21 184 lb 6.4 oz (83.6 kg)  09/09/21 185 lb (83.9 kg)     Objective:    Vital Signs:  Ht 5\' 8"  (1.727 m)   Wt 184 lb (83.5 kg)   BMI 27.98 kg/m    Physical Exam No physical exam performed due to telemedicine visit  ASSESSMENT & PLAN:    1. COVID-19 - nirmatrelvir/ritonavir EUA (PAXLOVID) 20 x 150 MG & 10 x 100MG  TABS; Take 3 tablets by mouth 2 (two) times daily for 5 days. (Take nirmatrelvir 150 mg two tablets twice daily for 5 days and ritonavir 100 mg one tablet twice daily for 5 days) Patient GFR is 97  Dispense: 30 tablet; Refill: 0   Rest and push fluids Symptom management with OTC medications  Hold cholesterol  medication while taking antiviral medications Follow-up as needed    COVID-19 Education: The signs and symptoms of COVID-19 were discussed with the patient and how to seek care for testing (follow up with PCP or arrange E-visit). The importance of social distancing was discussed today.   I spent 10 minutes dedicated to the care of this patient on the date of this encounter to include face-to-face time with the patient, as well as: EMR and prescription medication management.  Follow Up:  In  Person prn   I, Janie Morning, NP, have reviewed all documentation for this visit. The documentation on 11/07/21 for the exam, diagnosis, procedures, and orders are all accurate and complete.   Signed, Janie Morning, NP  11/07/2021 12:26 PM    [facility]

## 2021-11-08 ENCOUNTER — Other Ambulatory Visit: Payer: Self-pay

## 2021-11-08 DIAGNOSIS — U071 COVID-19: Secondary | ICD-10-CM

## 2021-11-08 MED ORDER — NIRMATRELVIR/RITONAVIR (PAXLOVID)TABLET: 3.0000 | ORAL_TABLET | Freq: Two times a day (BID) | ORAL | 0 refills | Status: AC

## 2021-11-08 NOTE — Telephone Encounter (Signed)
Pharmacy has not received rx for paxlovid. Resent rx.

## 2021-11-12 DIAGNOSIS — J343 Hypertrophy of nasal turbinates: Secondary | ICD-10-CM | POA: Insufficient documentation

## 2021-11-12 DIAGNOSIS — J342 Deviated nasal septum: Secondary | ICD-10-CM | POA: Insufficient documentation

## 2021-11-18 ENCOUNTER — Other Ambulatory Visit: Payer: Self-pay | Admitting: Family Medicine

## 2021-11-18 DIAGNOSIS — B351 Tinea unguium: Secondary | ICD-10-CM

## 2021-12-28 ENCOUNTER — Other Ambulatory Visit: Payer: Self-pay | Admitting: Family Medicine

## 2021-12-28 DIAGNOSIS — E782 Mixed hyperlipidemia: Secondary | ICD-10-CM

## 2022-01-15 ENCOUNTER — Telehealth: Payer: Self-pay

## 2022-01-15 NOTE — Telephone Encounter (Signed)
Patient was notified of severe sleep apnea.  Dr. Sedalia Muta recommended a CPAP.  Order has been faxed for CPAP.

## 2022-01-25 ENCOUNTER — Other Ambulatory Visit: Payer: Self-pay | Admitting: Family Medicine

## 2022-02-03 ENCOUNTER — Other Ambulatory Visit: Payer: Self-pay | Admitting: Family Medicine

## 2022-02-03 NOTE — Telephone Encounter (Signed)
Refill sent to pharmacy.   

## 2022-03-10 ENCOUNTER — Other Ambulatory Visit: Payer: Self-pay | Admitting: Family Medicine

## 2022-03-10 DIAGNOSIS — B351 Tinea unguium: Secondary | ICD-10-CM

## 2022-03-11 NOTE — Telephone Encounter (Signed)
Refill sent to pharmacy.   

## 2022-03-13 ENCOUNTER — Other Ambulatory Visit: Payer: Self-pay | Admitting: Family Medicine

## 2022-03-13 DIAGNOSIS — B351 Tinea unguium: Secondary | ICD-10-CM

## 2022-04-21 ENCOUNTER — Other Ambulatory Visit: Payer: Self-pay | Admitting: Family Medicine

## 2022-04-21 DIAGNOSIS — E782 Mixed hyperlipidemia: Secondary | ICD-10-CM

## 2022-04-21 DIAGNOSIS — Z0001 Encounter for general adult medical examination with abnormal findings: Secondary | ICD-10-CM

## 2022-06-03 ENCOUNTER — Other Ambulatory Visit: Payer: Self-pay | Admitting: Family Medicine

## 2022-06-03 DIAGNOSIS — Z0001 Encounter for general adult medical examination with abnormal findings: Secondary | ICD-10-CM

## 2022-07-01 ENCOUNTER — Other Ambulatory Visit: Payer: Self-pay | Admitting: Family Medicine

## 2022-07-01 DIAGNOSIS — Z0001 Encounter for general adult medical examination with abnormal findings: Secondary | ICD-10-CM

## 2022-07-22 ENCOUNTER — Other Ambulatory Visit: Payer: Self-pay | Admitting: Family Medicine

## 2022-07-22 DIAGNOSIS — E782 Mixed hyperlipidemia: Secondary | ICD-10-CM

## 2022-08-05 ENCOUNTER — Ambulatory Visit: Payer: 59 | Admitting: Family Medicine

## 2022-08-06 ENCOUNTER — Ambulatory Visit: Payer: Self-pay | Admitting: Family Medicine

## 2022-08-06 ENCOUNTER — Encounter: Payer: Self-pay | Admitting: Family Medicine

## 2022-08-06 VITALS — BP 100/78 | HR 82 | Temp 97.7°F | Ht 68.0 in | Wt 182.0 lb

## 2022-08-06 DIAGNOSIS — B351 Tinea unguium: Secondary | ICD-10-CM | POA: Insufficient documentation

## 2022-08-06 DIAGNOSIS — J3089 Other allergic rhinitis: Secondary | ICD-10-CM

## 2022-08-06 DIAGNOSIS — F3181 Bipolar II disorder: Secondary | ICD-10-CM

## 2022-08-06 DIAGNOSIS — G4733 Obstructive sleep apnea (adult) (pediatric): Secondary | ICD-10-CM

## 2022-08-06 DIAGNOSIS — E782 Mixed hyperlipidemia: Secondary | ICD-10-CM

## 2022-08-06 DIAGNOSIS — Z9989 Dependence on other enabling machines and devices: Secondary | ICD-10-CM

## 2022-08-06 MED ORDER — TERBINAFINE HCL 250 MG PO TABS: 250.0000 mg | ORAL_TABLET | Freq: Every day | ORAL | 0 refills | Status: DC

## 2022-08-06 NOTE — Progress Notes (Signed)
Subjective:  Patient ID: Matthew Wilcox, male    DOB: 13-May-1981  Age: 41 y.o. MRN: [id number]  Chief Complaint  Patient presents with   Hyperlipidemia   HPI: Hyperlipidemia: Patient is currently taking Pravastatin 40 mg once daily, Zetia 10 mg once daily, Lovaza 1 gm 2 capsules twice daily. Patient has been eating healthier.   Bipolar depression: Patient is currently taking Lamictal 150 mg 1 tablet daily, Duloxetine 20 mg capsule daily, and Buspirone 15 mg 1 tablet twice daily.  Onychomycosis: Patient is currently taking Lamisil 250mg  1 tablet daily.  OSA: cpap helps.    Current Outpatient Medications on File Prior to Visit  Medication Sig Dispense Refill   ezetimibe (ZETIA) 10 MG tablet Take 1 tablet by mouth once daily 90 tablet 0   ibuprofen (ADVIL) 800 MG tablet TAKE 1 TABLET BY MOUTH EVERY 8 HOURS AS NEEDED 90 tablet 0   lamoTRIgine (LAMICTAL) 150 MG tablet Take 150 mg by mouth at bedtime.     montelukast (SINGULAIR) 10 MG tablet TAKE 1 TABLET BY MOUTH EVERY DAY AT BEDTIME 30 tablet 0   pravastatin (PRAVACHOL) 40 MG tablet Take 1 tablet by mouth once daily 90 tablet 0   omega-3 acid ethyl esters (LOVAZA) 1 g capsule Take 2 capsules by mouth twice daily 360 capsule 0   No current facility-administered medications on file prior to visit.   Past Medical History:  Diagnosis Date   Bipolar disorder ([place])    GAD (generalized anxiety disorder)    Insomnia due to mental disorder    Mixed hyperlipidemia    Other speech disturbances    History reviewed. No pertinent surgical history.  Family History  Problem Relation Age of Onset   Depression Mother    Cancer Father        prostate   Depression Father    Social History   Socioeconomic History   Marital status: Married    Spouse name: Not on file   Number of children: Not on file   Years of education: Not on file   Highest education level: Not on file  Occupational History   Occupation: Curator  Tobacco Use    Smoking status: Never   Smokeless tobacco: Never  Substance and Sexual Activity   Alcohol use: Yes    Alcohol/week: 1.0 standard drink of alcohol    Types: 1 Cans of beer per week   Drug use: No   Sexual activity: Not on file  Other Topics Concern   Not on file  Social History Narrative   Not on file   Social Determinants of Health   Financial Resource Strain: Low Risk  (10/24/2021)   Overall Financial Resource Strain (CARDIA)    Difficulty of Paying Living Expenses: Not hard at all  Food Insecurity: No Food Insecurity (10/26/2021)   Hunger Vital Sign    Worried About Running Out of Food in the Last Year: Never true    Ran Out of Food in the Last Year: Never true  Transportation Needs: No Transportation Needs (10/24/2021)   PRAPARE - Hydrologist (Medical): No    Lack of Transportation (Non-Medical): No  Physical Activity: Inactive (10/26/2021)   Exercise Vital Sign    Days of Exercise per Week: 0 days    Minutes of Exercise per Session: 0 min  Stress: No Stress Concern Present (10/24/2021)   [place]    Feeling of Stress : Only a  little  Social Connections: Not on file    Review of Systems  Constitutional:  Negative for appetite change, fatigue and fever.  HENT:  Negative for congestion, ear pain, sinus pressure and sore throat.   Respiratory:  Negative for cough, chest tightness, shortness of breath and wheezing.   Cardiovascular:  Negative for chest pain and palpitations.  Gastrointestinal:  Negative for abdominal pain, constipation, diarrhea, nausea and vomiting.  Genitourinary:  Negative for dysuria and hematuria.  Musculoskeletal:  Negative for arthralgias, back pain, joint swelling and myalgias.  Skin:  Negative for rash.  Neurological:  Negative for dizziness, weakness and headaches.  Psychiatric/Behavioral:  Negative for dysphoric mood. The patient is not nervous/anxious.       Objective:  BP 100/78 (BP Location: Left Arm, Patient Position: Sitting)   Pulse 82   Temp 97.7 F (36.5 C) (Temporal)   Ht 5\' 8"  (1.727 m)   Wt 182 lb (82.6 kg)   SpO2 98%   BMI 27.67 kg/m      08/06/2022    2:25 PM 11/07/2021   12:11 PM 10/27/2021    8:07 PM  BP/Weight  Systolic BP 100  13/05/2021  Diastolic BP 78  72  Wt. (Lbs) 182 184 184.4  BMI 27.67 kg/m2 27.98 kg/m2 28.04 kg/m2    Physical Exam Vitals reviewed.  Constitutional:      Appearance: Normal appearance. He is normal weight.  Cardiovascular:     Rate and Rhythm: Normal rate and regular rhythm.     Heart sounds: Normal heart sounds.  Pulmonary:     Effort: Pulmonary effort is normal.     Breath sounds: Normal breath sounds.  Abdominal:     General: Abdomen is flat. Bowel sounds are normal.     Palpations: Abdomen is soft.     Tenderness: There is no abdominal tenderness.  Neurological:     Mental Status: He is alert and oriented to person, place, and time.  Psychiatric:        Mood and Affect: Mood normal.        Behavior: Behavior normal.    Lab Results  Component Value Date   WBC 5.3 10/24/2021   HGB 15.0 10/24/2021   HCT 43.4 10/24/2021   PLT 305 10/24/2021   GLUCOSE 89 08/06/2022   CHOL 153 08/06/2022   TRIG 190 (H) 08/06/2022   HDL 40 08/06/2022   LDLCALC 81 08/06/2022   ALT 26 08/06/2022   AST 20 08/06/2022   NA 143 08/06/2022   K 4.9 08/06/2022   CL 104 08/06/2022   CREATININE 0.94 08/06/2022   BUN 20 08/06/2022   CO2 22 08/06/2022   TSH 3.600 10/24/2021      Assessment & Plan:   Problem List Items Addressed This Visit       Respiratory   Allergic rhinitis due to allergen    Continue with Singulair 10 mg daily.        Musculoskeletal and Integument   Onychomycosis    The current medical regimen is effective;  continue present plan and medications. Taking Lamisil 250mg  1 tablet daily.      Relevant Medications   terbinafine (LAMISIL) 250 MG tablet     Other    Mixed hyperlipidemia - Primary    Well controlled.  No changes to medicines. Taking Pravastatin 40 mg once daily, Zetia 10 mg once daily, Lovaza 1 gm 2 capsules twice daily. Continue to work on eating a healthy diet and exercise.  Labs drawn today.  Relevant Orders   Comprehensive metabolic panel (Completed)   Lipid panel (Completed)   Mild bipolar II disorder, most recent episode major depressive (HCC)    The current medical regimen is effective;  continue present plan and medications. Currently taking Lamictal 150 mg 1 tablet daily, Duloxetine 20 mg capsule daily, and Buspirone 15 mg 1 tablet twice daily.      Apnea    cpap helps     .  Meds ordered this encounter  Medications   terbinafine (LAMISIL) 250 MG tablet    Sig: Take 1 tablet (250 mg total) by mouth daily.    Dispense:  90 tablet    Refill:  0    Orders Placed This Encounter  Procedures   Comprehensive metabolic panel   Lipid panel   Cardiovascular Risk Assessment     Follow-up: Return in about 3 months (around 11/06/2022) for chronic fasting.  An After Visit Summary was printed and given to the patient.   Blane Ohara, MD [facility] [phone]

## 2022-08-07 LAB — COMPREHENSIVE METABOLIC PANEL
ALT: 26 IU/L (ref 0–44)
AST: 20 IU/L (ref 0–40)
Albumin/Globulin Ratio: 2.3 — ABNORMAL HIGH (ref 1.2–2.2)
Albumin: 4.9 g/dL (ref 4.1–5.1)
Alkaline Phosphatase: 100 IU/L (ref 44–121)
BUN/Creatinine Ratio: 21 — ABNORMAL HIGH (ref 9–20)
BUN: 20 mg/dL (ref 6–20)
Bilirubin Total: 0.5 mg/dL (ref 0.0–1.2)
CO2: 22 mmol/L (ref 20–29)
Calcium: 10.1 mg/dL (ref 8.7–10.2)
Chloride: 104 mmol/L (ref 96–106)
Creatinine, Ser: 0.94 mg/dL (ref 0.76–1.27)
Globulin, Total: 2.1 g/dL (ref 1.5–4.5)
Glucose: 89 mg/dL (ref 70–99)
Potassium: 4.9 mmol/L (ref 3.5–5.2)
Sodium: 143 mmol/L (ref 134–144)
Total Protein: 7 g/dL (ref 6.0–8.5)
eGFR: 106 mL/min/{1.73_m2} (ref >59)

## 2022-08-07 LAB — LIPID PANEL
Chol/HDL Ratio: 3.8 ratio (ref 0.0–5.0)
Cholesterol, Total: 153 mg/dL (ref 100–199)
HDL: 40 mg/dL (ref >39)
LDL Chol Calc (NIH): 81 mg/dL (ref 0–99)
Triglycerides: 190 mg/dL — ABNORMAL HIGH (ref 0–149)
VLDL Cholesterol Cal: 32 mg/dL (ref 5–40)

## 2022-08-07 NOTE — Progress Notes (Signed)
Liver function normal.  Kidney function normal.  Cholesterol: trigs high at 190. LDL improved to 81. HDL good.  Please call his wife susanna and confirm if taking lovaza 1 gm 2 capsules twice daily. I have not filled it since January. Continue pravastatin. Need to take correct dose of lovaza.

## 2022-08-09 NOTE — Assessment & Plan Note (Signed)
cpap helps

## 2022-08-09 NOTE — Assessment & Plan Note (Signed)
The current medical regimen is effective;  continue present plan and medications. Currently taking Lamictal 150 mg 1 tablet daily, Duloxetine 20 mg capsule daily, and Buspirone 15 mg 1 tablet twice daily.

## 2022-08-09 NOTE — Assessment & Plan Note (Signed)
The current medical regimen is effective;  continue present plan and medications. Taking Lamisil 250mg  1 tablet daily.

## 2022-08-09 NOTE — Assessment & Plan Note (Signed)
Continue with Singulair 10 mg daily.

## 2022-08-09 NOTE — Assessment & Plan Note (Signed)
Well controlled.  No changes to medicines. Taking Pravastatin 40 mg once daily, Zetia 10 mg once daily, Lovaza 1 gm 2 capsules twice daily. Continue to work on eating a healthy diet and exercise.  Labs drawn today.

## 2022-08-12 ENCOUNTER — Other Ambulatory Visit: Payer: Self-pay

## 2022-08-12 MED ORDER — OMEGA-3-ACID ETHYL ESTERS 1 G PO CAPS: 2.0000 | ORAL_CAPSULE | Freq: Two times a day (BID) | ORAL | 0 refills | Status: DC

## 2022-09-08 ENCOUNTER — Other Ambulatory Visit: Payer: Self-pay | Admitting: Family Medicine

## 2022-09-08 DIAGNOSIS — Z0001 Encounter for general adult medical examination with abnormal findings: Secondary | ICD-10-CM

## 2022-10-27 ENCOUNTER — Other Ambulatory Visit: Payer: Self-pay | Admitting: Family Medicine

## 2022-10-27 DIAGNOSIS — E782 Mixed hyperlipidemia: Secondary | ICD-10-CM

## 2023-01-14 ENCOUNTER — Other Ambulatory Visit: Payer: Self-pay | Admitting: Family Medicine

## 2023-01-26 ENCOUNTER — Other Ambulatory Visit: Payer: Self-pay | Admitting: Family Medicine

## 2023-01-26 DIAGNOSIS — E782 Mixed hyperlipidemia: Secondary | ICD-10-CM

## 2023-02-22 ENCOUNTER — Other Ambulatory Visit: Payer: Self-pay | Admitting: Family Medicine

## 2023-02-22 DIAGNOSIS — E782 Mixed hyperlipidemia: Secondary | ICD-10-CM

## 2023-03-18 NOTE — Progress Notes (Signed)
Subjective:  Patient ID: Matthew Wilcox, male    DOB: 12/25/1980  Age: 42 y.o. MRN: [id number]  Chief Complaint  Patient presents with   Hyperlipidemia    HPI: Hyperlipidemia: Patient is currently taking Pravastatin 40 mg once daily, Zetia 10 mg once daily, Lovaza 1 gm 2 capsules twice daily. Patient has been eating healthier.    Bipolar depression: Patient is currently taking Lamictal 150 mg 1 tablet daily, Duloxetine 20 mg capsule daily, and Buspirone 15 mg 1 tablet twice daily.   Onychomycosis: Patient is currently taking Lamisil 250mg  1 tablet daily.   OSA (Severe): Unsure if cpap helps. Does not fell rested.      08/06/2022    3:07 PM 10/24/2021    9:19 PM 09/09/2021    3:48 PM 05/21/2021    8:40 AM 04/22/2020    3:27 PM  Depression screen PHQ 2/9  Decreased Interest 0 0 0 0 0  Down, Depressed, Hopeless 0 0 0 0 0  PHQ - 2 Score 0 0 0 0 0  Altered sleeping 1    1  Tired, decreased energy 0    0  Change in appetite 0    0  Feeling bad or failure about yourself  0    0  Trouble concentrating 0    0  Moving slowly or fidgety/restless 0    0  Suicidal thoughts 0    0  PHQ-9 Score 1    1  Difficult doing work/chores Not difficult at all    Not difficult at all         05/21/2021    8:40 AM 09/09/2021    3:47 PM  Fall Risk  Falls in the past year? 0 0  Was there an injury with Fall? 0 0  Fall Risk Category Calculator 0 0  Fall Risk Category (Retired) Low Low  (RETIRED) Patient Fall Risk Level Low fall risk Low fall risk  Patient at Risk for Falls Due to No Fall Risks No Fall Risks  Fall risk Follow up Falls evaluation completed Falls evaluation completed      Review of Systems  Constitutional:  Negative for chills, diaphoresis, fatigue and fever.  HENT:  Positive for congestion. Negative for ear pain and sore throat.   Respiratory:  Negative for cough and shortness of breath.   Cardiovascular:  Negative for chest pain and leg swelling.  Gastrointestinal:  Negative  for abdominal pain, constipation, diarrhea, nausea and vomiting.  Genitourinary:  Negative for dysuria and urgency.  Musculoskeletal:  Negative for arthralgias and myalgias.  Neurological:  Negative for dizziness and headaches.  Psychiatric/Behavioral:  Negative for dysphoric mood.     Current Outpatient Medications on File Prior to Visit  Medication Sig Dispense Refill   busPIRone (BUSPAR) 15 MG tablet Take 15 mg by mouth 3 (three) times daily.     DULoxetine (CYMBALTA) 20 MG capsule Take 40 mg by mouth daily. Take 2 tablets by mouth daily     ezetimibe (ZETIA) 10 MG tablet TAKE 1 TABLET BY MOUTH ONCE DAILY **RECOMMEND  CALL  FOR  A  FASTING  APPOINTMENT  PER  DR  Taje Tondreau** 90 tablet 1   ibuprofen (ADVIL) 800 MG tablet TAKE 1 TABLET BY MOUTH EVERY 8 HOURS AS NEEDED 90 tablet 0   lamoTRIgine (LAMICTAL) 150 MG tablet Take 150 mg by mouth at bedtime.     montelukast (SINGULAIR) 10 MG tablet TAKE 1 TABLET BY MOUTH ONCE DAILY AT BEDTIME 90 tablet 1  omega-3 acid ethyl esters (LOVAZA) 1 g capsule Take 2 capsules by mouth twice daily 360 capsule 0   pravastatin (PRAVACHOL) 40 MG tablet TAKE 1 TABLET BY MOUTH ONCE DAILY . APPOINTMENT REQUIRED FOR FUTURE REFILLS 90 tablet 0   terbinafine (LAMISIL) 250 MG tablet Take 1 tablet (250 mg total) by mouth daily. 90 tablet 0   No current facility-administered medications on file prior to visit.   Past Medical History:  Diagnosis Date   Bipolar disorder (HCC)    GAD (generalized anxiety disorder)    Insomnia due to mental disorder    Mixed hyperlipidemia    Other speech disturbances    Past Surgical History:  Procedure Laterality Date   NO PAST SURGERIES      Family History  Problem Relation Age of Onset   Depression Mother    Cancer Father        prostate   Depression Father    Social History   Socioeconomic History   Marital status: Married    Spouse name: Not on file   Number of children: Not on file   Years of education: Not on file    Highest education level: Not on file  Occupational History   Occupation: Curator  Tobacco Use   Smoking status: Never   Smokeless tobacco: Never  Vaping Use   Vaping Use: Never used  Substance and Sexual Activity   Alcohol use: Yes    Alcohol/week: 2.0 standard drinks of alcohol    Types: 1 Cans of beer, 1 Standard drinks or equivalent per week    Comment: weekly   Drug use: No   Sexual activity: Not Currently  Other Topics Concern   Not on file  Social History Narrative   Not on file   Social Determinants of Health   Financial Resource Strain: Low Risk  (10/24/2021)   Overall Financial Resource Strain (CARDIA)    Difficulty of Paying Living Expenses: Not hard at all  Food Insecurity: No Food Insecurity (10/26/2021)   Hunger Vital Sign    Worried About Running Out of Food in the Last Year: Never true    Ran Out of Food in the Last Year: Never true  Transportation Needs: No Transportation Needs (10/24/2021)   PRAPARE - Hydrologist (Medical): No    Lack of Transportation (Non-Medical): No  Physical Activity: Inactive (10/26/2021)   Exercise Vital Sign    Days of Exercise per Week: 0 days    Minutes of Exercise per Session: 0 min  Stress: No Stress Concern Present (10/24/2021)   [place]    Feeling of Stress : Only a little  Social Connections: Not on file    Objective:  BP 98/80   Pulse 94   Temp (!) 97.5 F (36.4 C)   Resp 14   Ht 5\' 8"  (1.727 m)   Wt 194 lb (88 kg)   SpO2 99%   BMI 29.50 kg/m      03/19/2023   11:21 AM 08/06/2022    2:25 PM 11/07/2021   12:11 PM  BP/Weight  Systolic BP 98 [id number]   Diastolic BP 80 78   Wt. (Lbs) 194 182 184  BMI 29.5 kg/m2 27.67 kg/m2 27.98 kg/m2    Physical Exam Vitals reviewed.  Constitutional:      Appearance: Normal appearance. He is normal weight.  Cardiovascular:     Rate and Rhythm: Normal rate and regular rhythm.  Heart sounds: No murmur heard. Pulmonary:     Effort: Pulmonary effort is normal.     Breath sounds: Normal breath sounds.  Abdominal:     General: Abdomen is flat. Bowel sounds are normal.     Palpations: Abdomen is soft.     Tenderness: There is no abdominal tenderness.  Neurological:     Mental Status: He is alert and oriented to person, place, and time.  Psychiatric:        Mood and Affect: Mood normal.        Behavior: Behavior normal.     Diabetic Foot Exam - Simple   No data filed      Lab Results  Component Value Date   WBC 5.1 03/19/2023   HGB 15.0 03/19/2023   HCT 43.7 03/19/2023   PLT 206 03/19/2023   GLUCOSE 91 03/19/2023   CHOL 185 03/19/2023   TRIG 200 (H) 03/19/2023   HDL 44 03/19/2023   LDLCALC 106 (H) 03/19/2023   ALT 36 03/19/2023   AST 23 03/19/2023   NA 139 03/19/2023   K 4.1 03/19/2023   CL 101 03/19/2023   CREATININE 0.95 03/19/2023   BUN 12 03/19/2023   CO2 22 03/19/2023   TSH 3.600 10/24/2021      Assessment & Plan:    Mixed hyperlipidemia Assessment & Plan: The current medical regimen is effective;  continue present plan and medications. Continue Pravastatin 40 mg once daily, Zetia 10 mg once daily, Lovaza 1 gm 2 capsules twice daily. Continue to work on eating a healthy diet and exercise.  Labs drawn today.    Orders: -     CBC with Differential/Platelet -     Comprehensive metabolic panel -     Lipid panel  Mild bipolar II disorder, most recent episode major depressive (HCC) Assessment & Plan: The current medical regimen is effective;  continue present plan and medications. Continue Lamictal 150 mg 1 tablet daily, Duloxetine 20 mg capsule daily, and Buspirone 15 mg 1 tablet twice daily.   OSA (obstructive sleep apnea) Assessment & Plan: Patient has not been using cpap daily because he felt it was not helping.  Recommend we call [facility] to see if they can check pressures.    Onychomycosis Assessment & Plan: Improved.     Other orders -     Cardiovascular Risk Assessment     No orders of the defined types were placed in this encounter.   Orders Placed This Encounter  Procedures   CBC with Differential/Platelet   Comprehensive metabolic panel   Lipid panel   Cardiovascular Risk Assessment     Follow-up: No follow-ups on file.   I,Katherina A Bramblett,acting as a scribe for Rochel Brome, MD.,have documented all relevant documentation on the behalf of Rochel Brome, MD,as directed by  Rochel Brome, MD while in the presence of Rochel Brome, MD.   An After Visit Summary was printed and given to the patient.  I attest that I have reviewed this visit and agree with the plan scribed by my staff.   Rochel Brome, MD [facility] [phone]

## 2023-03-19 ENCOUNTER — Ambulatory Visit: Payer: Self-pay | Admitting: Family Medicine

## 2023-03-19 ENCOUNTER — Encounter: Payer: Self-pay | Admitting: Family Medicine

## 2023-03-19 VITALS — BP 98/80 | HR 94 | Temp 97.5°F | Resp 14 | Ht 68.0 in | Wt 194.0 lb

## 2023-03-19 DIAGNOSIS — F3181 Bipolar II disorder: Secondary | ICD-10-CM

## 2023-03-19 DIAGNOSIS — B351 Tinea unguium: Secondary | ICD-10-CM

## 2023-03-19 DIAGNOSIS — G4733 Obstructive sleep apnea (adult) (pediatric): Secondary | ICD-10-CM | POA: Insufficient documentation

## 2023-03-19 DIAGNOSIS — E782 Mixed hyperlipidemia: Secondary | ICD-10-CM

## 2023-03-19 NOTE — Patient Instructions (Signed)
[email or web address].[email or web address] Check this out. Perhaps you can get some of your medicines for free.

## 2023-03-20 LAB — LIPID PANEL
Chol/HDL Ratio: 4.2 ratio (ref 0.0–5.0)
Cholesterol, Total: 185 mg/dL (ref 100–199)
HDL: 44 mg/dL (ref >39)
LDL Chol Calc (NIH): 106 mg/dL — ABNORMAL HIGH (ref 0–99)
Triglycerides: 200 mg/dL — ABNORMAL HIGH (ref 0–149)
VLDL Cholesterol Cal: 35 mg/dL (ref 5–40)

## 2023-03-20 LAB — COMPREHENSIVE METABOLIC PANEL
ALT: 36 IU/L (ref 0–44)
AST: 23 IU/L (ref 0–40)
Albumin/Globulin Ratio: 2.3 — ABNORMAL HIGH (ref 1.2–2.2)
Albumin: 4.8 g/dL (ref 4.1–5.1)
Alkaline Phosphatase: 94 IU/L (ref 44–121)
BUN/Creatinine Ratio: 13 (ref 9–20)
BUN: 12 mg/dL (ref 6–24)
Bilirubin Total: 0.6 mg/dL (ref 0.0–1.2)
CO2: 22 mmol/L (ref 20–29)
Calcium: 9.6 mg/dL (ref 8.7–10.2)
Chloride: 101 mmol/L (ref 96–106)
Creatinine, Ser: 0.95 mg/dL (ref 0.76–1.27)
Globulin, Total: 2.1 g/dL (ref 1.5–4.5)
Glucose: 91 mg/dL (ref 70–99)
Potassium: 4.1 mmol/L (ref 3.5–5.2)
Sodium: 139 mmol/L (ref 134–144)
Total Protein: 6.9 g/dL (ref 6.0–8.5)
eGFR: 104 mL/min/{1.73_m2} (ref >59)

## 2023-03-20 LAB — CBC WITH DIFFERENTIAL/PLATELET
Basophils Absolute: 0 10*3/uL (ref 0.0–0.2)
Basos: 0 %
EOS (ABSOLUTE): 0.1 10*3/uL (ref 0.0–0.4)
Eos: 1 %
Hematocrit: 43.7 % (ref 37.5–51.0)
Hemoglobin: 15 g/dL (ref 13.0–17.7)
Immature Grans (Abs): 0 10*3/uL (ref 0.0–0.1)
Immature Granulocytes: 0 %
Lymphocytes Absolute: 2.5 10*3/uL (ref 0.7–3.1)
Lymphs: 49 %
MCH: 30.9 pg (ref 26.6–33.0)
MCHC: 34.3 g/dL (ref 31.5–35.7)
MCV: 90 fL (ref 79–97)
Monocytes Absolute: 0.4 10*3/uL (ref 0.1–0.9)
Monocytes: 8 %
Neutrophils Absolute: 2.1 10*3/uL (ref 1.4–7.0)
Neutrophils: 42 %
Platelets: 206 10*3/uL (ref 150–450)
RBC: 4.85 x10E6/uL (ref 4.14–5.80)
RDW: 12.4 % (ref 11.6–15.4)
WBC: 5.1 10*3/uL (ref 3.4–10.8)

## 2023-03-20 LAB — CARDIOVASCULAR RISK ASSESSMENT

## 2023-03-21 NOTE — Progress Notes (Signed)
Blood count normal.  Liver function normal.  Kidney function normal.  Cholesterol: LDL little up. Trigs 200. Goal less than 150.  Recommend otc fish oil 2 gms twice daily.

## 2023-03-22 ENCOUNTER — Other Ambulatory Visit: Payer: Self-pay | Admitting: Family Medicine

## 2023-03-22 DIAGNOSIS — Z0001 Encounter for general adult medical examination with abnormal findings: Secondary | ICD-10-CM

## 2023-03-22 NOTE — Assessment & Plan Note (Signed)
>>  ASSESSMENT AND PLAN FOR OSA (OBSTRUCTIVE SLEEP APNEA) WRITTEN ON 03/22/2023  7:28 PM BY Gerad Cornelio, MD  Patient has not been using cpap daily because he felt it was not helping.  Recommend we call [facility] to see if they can check pressures.

## 2023-03-22 NOTE — Assessment & Plan Note (Signed)
Improved

## 2023-03-22 NOTE — Assessment & Plan Note (Signed)
Patient has not been using cpap daily because he felt it was not helping.  Recommend we call [facility] to see if they can check pressures.

## 2023-03-22 NOTE — Assessment & Plan Note (Signed)
The current medical regimen is effective;  continue present plan and medications. Continue Pravastatin 40 mg once daily, Zetia 10 mg once daily, Lovaza 1 gm 2 capsules twice daily. Continue to work on eating a healthy diet and exercise.  Labs drawn today.

## 2023-03-22 NOTE — Assessment & Plan Note (Signed)
The current medical regimen is effective;  continue present plan and medications. Continue Lamictal 150 mg 1 tablet daily, Duloxetine 20 mg capsule daily, and Buspirone 15 mg 1 tablet twice daily.

## 2023-03-23 ENCOUNTER — Other Ambulatory Visit: Payer: Self-pay

## 2023-03-23 DIAGNOSIS — Z0001 Encounter for general adult medical examination with abnormal findings: Secondary | ICD-10-CM

## 2023-03-23 MED ORDER — EZETIMIBE 10 MG PO TABS: ORAL_TABLET | ORAL | 0 refills | Status: DC

## 2023-03-23 MED ORDER — MONTELUKAST SODIUM 10 MG PO TABS: 10.0000 mg | ORAL_TABLET | Freq: Every day | ORAL | 0 refills | Status: DC

## 2023-04-21 ENCOUNTER — Other Ambulatory Visit: Payer: Self-pay | Admitting: Family Medicine

## 2023-06-04 ENCOUNTER — Other Ambulatory Visit: Payer: Self-pay | Admitting: Family Medicine

## 2023-06-04 DIAGNOSIS — E782 Mixed hyperlipidemia: Secondary | ICD-10-CM

## 2023-06-11 ENCOUNTER — Encounter: Payer: Self-pay | Admitting: Family Medicine

## 2023-08-05 ENCOUNTER — Other Ambulatory Visit: Payer: Self-pay | Admitting: Family Medicine

## 2023-08-05 DIAGNOSIS — E782 Mixed hyperlipidemia: Secondary | ICD-10-CM

## 2023-09-15 ENCOUNTER — Encounter: Payer: Self-pay | Admitting: Physician Assistant

## 2023-10-04 NOTE — Progress Notes (Unsigned)
Subjective:  Patient ID: Matthew Wilcox, male    DOB: [birth year]  Age: 42 y.o. MRN: [id number]  No chief complaint on file.   HPI  Well Adult Physical: Patient here for a comprehensive physical exam.The patient reports {problems:16946} Do you take any herbs or supplements that were not prescribed by a doctor? {yes/no/not asked:9010} Are you taking calcium supplements? {yes/no:63} Are you taking aspirin daily? {yes/no:63}  Encounter for general adult medical examination without abnormal findings  Physical ("At Risk" items are starred): Patient's last physical exam was 1 year ago .  Patient wears a seat belt, has smoke detectors, has carbon monoxide detectors, practices appropriate gun safety, and wears sunscreen with extended sun exposure. Dental Care: biannual cleanings, brushes and flosses daily. Ophthalmology/Optometry: Annual visit.  Hearing loss: none Vision impairments: none Last PSA:     08/06/2022    3:07 PM 10/24/2021    9:19 PM 09/09/2021    3:48 PM 05/21/2021    8:40 AM 04/22/2020    3:27 PM  Depression screen PHQ 2/9  Decreased Interest 0 0 0 0 0  Down, Depressed, Hopeless 0 0 0 0 0  PHQ - 2 Score 0 0 0 0 0  Altered sleeping 1    1  Tired, decreased energy 0    0  Change in appetite 0    0  Feeling bad or failure about yourself  0    0  Trouble concentrating 0    0  Moving slowly or fidgety/restless 0    0  Suicidal thoughts 0    0  PHQ-9 Score 1    1  Difficult doing work/chores Not difficult at all    Not difficult at all         05/21/2021    8:40 AM 09/09/2021    3:47 PM  Fall Risk  Falls in the past year? 0 0  Was there an injury with Fall? 0 0  Fall Risk Category Calculator 0 0  Fall Risk Category (Retired) Low Low  (RETIRED) Patient Fall Risk Level Low fall risk Low fall risk  Patient at Risk for Falls Due to No Fall Risks No Fall Risks  Fall risk Follow up Falls evaluation completed Falls evaluation completed              Past Medical History:   Diagnosis Date   Bipolar disorder (HCC)    GAD (generalized anxiety disorder)    Insomnia due to mental disorder    Mixed hyperlipidemia    Other speech disturbances    Past Surgical History:  Procedure Laterality Date   NO PAST SURGERIES      Family History  Problem Relation Age of Onset   Depression Mother    Cancer Father        prostate   Depression Father    Social History   Socioeconomic History   Marital status: Married    Spouse name: Not on file   Number of children: Not on file   Years of education: Not on file   Highest education level: Not on file  Occupational History   Occupation: Education administrator  Tobacco Use   Smoking status: Never   Smokeless tobacco: Never  Vaping Use   Vaping status: Never Used  Substance and Sexual Activity   Alcohol use: Yes    Alcohol/week: 2.0 standard drinks of alcohol    Types: 1 Cans of beer, 1 Standard drinks or equivalent per week    Comment: weekly  Drug use: No   Sexual activity: Not Currently  Other Topics Concern   Not on file  Social History Narrative   Not on file   Social Determinants of Health   Financial Resource Strain: Low Risk  (10/24/2021)   Overall Financial Resource Strain (CARDIA)    Difficulty of Paying Living Expenses: Not hard at all  Food Insecurity: No Food Insecurity (10/26/2021)   Hunger Vital Sign    Worried About Running Out of Food in the Last Year: Never true    Ran Out of Food in the Last Year: Never true  Transportation Needs: No Transportation Needs (10/24/2021)   PRAPARE - Administrator, Civil Service (Medical): No    Lack of Transportation (Non-Medical): No  Physical Activity: Inactive (10/26/2021)   Exercise Vital Sign    Days of Exercise per Week: 0 days    Minutes of Exercise per Session: 0 min  Stress: No Stress Concern Present (10/24/2021)   [employer] of Occupational Health - Occupational Stress Questionnaire    Feeling of Stress : Only a little  Social  Connections: Not on file   Review of Systems   Objective:  There were no vitals taken for this visit.     03/19/2023   11:21 AM 08/06/2022    2:25 PM 11/07/2021   12:11 PM  BP/Weight  Systolic BP 98 100   Diastolic BP 80 78   Wt. (Lbs) 194 182 184  BMI 29.5 kg/m2 27.67 kg/m2 27.98 kg/m2    Physical Exam  Lab Results  Component Value Date   WBC 5.1 03/19/2023   HGB 15.0 03/19/2023   HCT 43.7 03/19/2023   PLT 206 03/19/2023   GLUCOSE 91 03/19/2023   CHOL 185 03/19/2023   TRIG 200 (H) 03/19/2023   HDL 44 03/19/2023   LDLCALC 106 (H) 03/19/2023   ALT 36 03/19/2023   AST 23 03/19/2023   NA 139 03/19/2023   K 4.1 03/19/2023   CL 101 03/19/2023   CREATININE 0.95 03/19/2023   BUN 12 03/19/2023   CO2 22 03/19/2023   TSH 3.600 10/24/2021      Assessment & Plan:  There are no diagnoses linked to this encounter.   There is no height or weight on file to calculate BMI.   These are the goals we discussed:  Goals   None      This is a list of the screening recommended for you and due dates:  Health Maintenance  Topic Date Due   HIV Screening  Never done   Hepatitis C Screening  Never done   Flu Shot  07/23/2023   COVID-19 Vaccine (1 - 2023-24 season) Never done   DTaP/Tdap/Td vaccine (2 - Td or Tdap) 10/25/2031   HPV Vaccine  Aged Out     No orders of the defined types were placed in this encounter.    Follow-up: No follow-ups on file.  An After Visit Summary was printed and given to the patient.  Langley Gauss, [facility] [phone]

## 2023-10-05 ENCOUNTER — Ambulatory Visit: Payer: Self-pay | Admitting: Physician Assistant

## 2023-10-05 ENCOUNTER — Encounter: Payer: Self-pay | Admitting: Physician Assistant

## 2023-10-05 VITALS — BP 110/62 | HR 89 | Temp 97.0°F | Resp 16 | Ht 68.0 in | Wt 188.2 lb

## 2023-10-05 DIAGNOSIS — K219 Gastro-esophageal reflux disease without esophagitis: Secondary | ICD-10-CM

## 2023-10-05 DIAGNOSIS — Z8042 Family history of malignant neoplasm of prostate: Secondary | ICD-10-CM

## 2023-10-05 DIAGNOSIS — Z23 Encounter for immunization: Secondary | ICD-10-CM | POA: Insufficient documentation

## 2023-10-05 DIAGNOSIS — G4733 Obstructive sleep apnea (adult) (pediatric): Secondary | ICD-10-CM

## 2023-10-05 DIAGNOSIS — E782 Mixed hyperlipidemia: Secondary | ICD-10-CM

## 2023-10-05 DIAGNOSIS — Z1159 Encounter for screening for other viral diseases: Secondary | ICD-10-CM | POA: Insufficient documentation

## 2023-10-05 MED ORDER — OMEPRAZOLE 20 MG PO CPDR
20.0000 mg | DELAYED_RELEASE_CAPSULE | Freq: Every day | ORAL | 3 refills | Status: DC
Start: 2023-10-05 — End: 2023-11-18

## 2023-10-05 NOTE — Assessment & Plan Note (Signed)
PSA drawn today Will continue to monitor symptoms

## 2023-10-05 NOTE — Assessment & Plan Note (Signed)
Controlled on CPAP Will continue to monitor symptoms Will adjust as needed

## 2023-10-05 NOTE — Addendum Note (Signed)
Addended by: Langley Gauss on: 10/05/2023 11:51 AM   Modules accepted: Level of Service

## 2023-10-05 NOTE — Assessment & Plan Note (Signed)
Uncontrolled Will take omeprazole BID for 1 week then every day after Will let us know if symptoms worsen or change Will refer to GI for scope if needed

## 2023-10-05 NOTE — Assessment & Plan Note (Signed)
Well controlled.  Continue to work on eating a healthy diet and exercise.  Labs drawn today.   No major side effects reported, and no issues with compliance. The current medical regimen is effective;  continue present plan with Zetia 10mg , Pravastatin 40mg ,  Will adjust medication as needed depending on labs Lab Results  Component Value Date   LDLCALC 106 (H) 03/19/2023

## 2023-10-20 ENCOUNTER — Other Ambulatory Visit: Payer: Self-pay

## 2023-10-20 ENCOUNTER — Other Ambulatory Visit: Payer: Self-pay | Admitting: Physician Assistant

## 2023-10-20 DIAGNOSIS — K219 Gastro-esophageal reflux disease without esophagitis: Secondary | ICD-10-CM

## 2023-10-20 DIAGNOSIS — Z8042 Family history of malignant neoplasm of prostate: Secondary | ICD-10-CM

## 2023-10-20 DIAGNOSIS — Z0001 Encounter for general adult medical examination with abnormal findings: Secondary | ICD-10-CM

## 2023-10-20 DIAGNOSIS — E782 Mixed hyperlipidemia: Secondary | ICD-10-CM

## 2023-10-20 DIAGNOSIS — Z1159 Encounter for screening for other viral diseases: Secondary | ICD-10-CM

## 2023-10-21 LAB — PSA: Prostate Specific Ag, Serum: 1.3 ng/mL (ref 0.0–4.0)

## 2023-10-21 LAB — CBC WITH DIFFERENTIAL/PLATELET
Basophils Absolute: 0 10*3/uL (ref 0.0–0.2)
Basos: 0 %
EOS (ABSOLUTE): 0.1 10*3/uL (ref 0.0–0.4)
Eos: 1 %
Hematocrit: 44.9 % (ref 37.5–51.0)
Hemoglobin: 15.4 g/dL (ref 13.0–17.7)
Immature Grans (Abs): 0 10*3/uL (ref 0.0–0.1)
Immature Granulocytes: 0 %
Lymphocytes Absolute: 2.1 10*3/uL (ref 0.7–3.1)
Lymphs: 41 %
MCH: 30.7 pg (ref 26.6–33.0)
MCHC: 34.3 g/dL (ref 31.5–35.7)
MCV: 89 fL (ref 79–97)
Monocytes Absolute: 0.4 10*3/uL (ref 0.1–0.9)
Monocytes: 7 %
Neutrophils Absolute: 2.6 10*3/uL (ref 1.4–7.0)
Neutrophils: 51 %
Platelets: 235 10*3/uL (ref 150–450)
RBC: 5.02 x10E6/uL (ref 4.14–5.80)
RDW: 12.3 % (ref 11.6–15.4)
WBC: 5.2 10*3/uL (ref 3.4–10.8)

## 2023-10-21 LAB — LIPID PANEL
Chol/HDL Ratio: 4.2 ratio (ref 0.0–5.0)
Cholesterol, Total: 184 mg/dL (ref 100–199)
HDL: 44 mg/dL (ref >39)
LDL Chol Calc (NIH): 114 mg/dL — ABNORMAL HIGH (ref 0–99)
Triglycerides: 149 mg/dL (ref 0–149)
VLDL Cholesterol Cal: 26 mg/dL (ref 5–40)

## 2023-10-21 LAB — COMPREHENSIVE METABOLIC PANEL
ALT: 43 [IU]/L (ref 0–44)
AST: 23 [IU]/L (ref 0–40)
Albumin: 4.7 g/dL (ref 4.1–5.1)
Alkaline Phosphatase: 92 [IU]/L (ref 44–121)
BUN/Creatinine Ratio: 13 (ref 9–20)
BUN: 13 mg/dL (ref 6–24)
Bilirubin Total: 0.7 mg/dL (ref 0.0–1.2)
CO2: 21 mmol/L (ref 20–29)
Calcium: 9.6 mg/dL (ref 8.7–10.2)
Chloride: 103 mmol/L (ref 96–106)
Creatinine, Ser: 0.98 mg/dL (ref 0.76–1.27)
Globulin, Total: 2.1 g/dL (ref 1.5–4.5)
Glucose: 106 mg/dL — ABNORMAL HIGH (ref 70–99)
Potassium: 4.7 mmol/L (ref 3.5–5.2)
Sodium: 141 mmol/L (ref 134–144)
Total Protein: 6.8 g/dL (ref 6.0–8.5)
eGFR: 99 mL/min/{1.73_m2} (ref >59)

## 2023-10-21 LAB — HEMOGLOBIN A1C
Est. average glucose Bld gHb Est-mCnc: 105 mg/dL
Hgb A1c MFr Bld: 5.3 % (ref 4.8–5.6)

## 2023-11-11 ENCOUNTER — Other Ambulatory Visit: Payer: Self-pay | Admitting: Physician Assistant

## 2023-11-11 ENCOUNTER — Other Ambulatory Visit: Payer: Self-pay | Admitting: Family Medicine

## 2023-11-11 DIAGNOSIS — Z0001 Encounter for general adult medical examination with abnormal findings: Secondary | ICD-10-CM

## 2023-11-11 DIAGNOSIS — E782 Mixed hyperlipidemia: Secondary | ICD-10-CM

## 2023-11-18 ENCOUNTER — Other Ambulatory Visit: Payer: Self-pay

## 2023-11-18 DIAGNOSIS — K219 Gastro-esophageal reflux disease without esophagitis: Secondary | ICD-10-CM

## 2023-11-18 MED ORDER — OMEPRAZOLE 20 MG PO CPDR: 20.0000 mg | DELAYED_RELEASE_CAPSULE | Freq: Every day | ORAL | 3 refills | Status: DC

## 2023-12-02 ENCOUNTER — Encounter: Payer: Self-pay | Admitting: Family Medicine

## 2024-04-03 NOTE — Progress Notes (Unsigned)
 Subjective:  Patient ID: Matthew Wilcox, male    DOB: 1981/05/05  Age: 43 y.o. MRN: [id number]  Chief Complaint  Patient presents with   Medical Management of Chronic Issues    Hyperlipidemia: Patient is currently taking Pravastatin 40 mg once daily, Zetia 10 mg once daily, Lovaza 1 gm 2 capsules twice daily. Patient has been eating healthier.    Bipolar depression: Patient is currently taking Lamictal 150 mg 1 tablet daily, Duloxetine 20 mg capsule 2 daily, and Buspirone 15 mg 1 tablet three times daily. He has been seeing psychiatry, but as his symptoms are stable he would prefer to come here.     OSA (Severe): Not using CPAP because has issues with sleeping. Patient does not sleep well and feels cpap equipment frustrates him.       04/04/2024    8:37 AM 10/05/2023    8:44 AM 08/06/2022    3:07 PM 10/24/2021    9:19 PM 09/09/2021    3:48 PM  Depression screen PHQ 2/9  Decreased Interest 0 0 0 0 0  Down, Depressed, Hopeless 0 0 0 0 0  PHQ - 2 Score 0 0 0 0 0  Altered sleeping 3 2 1     Tired, decreased energy 0 1 0    Change in appetite 0 0 0    Feeling bad or failure about yourself  0 0 0    Trouble concentrating 0 0 0    Moving slowly or fidgety/restless 1 0 0    Suicidal thoughts 0 0 0    PHQ-9 Score 4 3 1     Difficult doing work/chores Not difficult at all  Not difficult at all          04/04/2024    8:27 AM  Fall Risk   Falls in the past year? 0  Number falls in past yr: 0  Injury with Fall? 0  Risk for fall due to : No Fall Risks  Follow up Falls evaluation completed    Patient Care Team: Mercy Stall, MD as PCP - General (Family Medicine)   Review of Systems  Constitutional:  Negative for chills, diaphoresis, fatigue and fever.  HENT:  Negative for congestion, ear pain and sore throat.   Respiratory:  Negative for cough and shortness of breath.   Cardiovascular:  Negative for chest pain and leg swelling.  Gastrointestinal:  Negative for abdominal pain,  constipation, diarrhea, nausea and vomiting.  Genitourinary:  Negative for dysuria and urgency.  Musculoskeletal:  Positive for back pain. Negative for arthralgias and myalgias.  Neurological:  Negative for dizziness and headaches.  Psychiatric/Behavioral:  Negative for dysphoric mood.     Current Outpatient Medications on File Prior to Visit  Medication Sig Dispense Refill   busPIRone (BUSPAR) 15 MG tablet Take 15 mg by mouth 3 (three) times daily.     DULoxetine (CYMBALTA) 20 MG capsule Take 40 mg by mouth daily. Take 2 tablets by mouth daily     lamoTRIgine (LAMICTAL) 150 MG tablet Take 150 mg by mouth at bedtime.     pravastatin (PRAVACHOL) 40 MG tablet TAKE 1 TABLET BY MOUTH ONCE DAILY APPOINTMENT  REQUIRED  FOR  FURTHER  REFILLS 90 tablet 0   terbinafine (LAMISIL) 250 MG tablet Take 1 tablet (250 mg total) by mouth daily. 90 tablet 0   No current facility-administered medications on file prior to visit.   Past Medical History:  Diagnosis Date   Bipolar disorder (HCC)    GAD (generalized  anxiety disorder)    Insomnia due to mental disorder    Mixed hyperlipidemia    Other speech disturbances    Past Surgical History:  Procedure Laterality Date   NO PAST SURGERIES      Family History  Problem Relation Age of Onset   Depression Mother    Cancer Father        prostate   Depression Father    Social History   Socioeconomic History   Marital status: Married    Spouse name: Not on file   Number of children: Not on file   Years of education: Not on file   Highest education level: Not on file  Occupational History   Occupation: Education administrator  Tobacco Use   Smoking status: Never   Smokeless tobacco: Never  Vaping Use   Vaping status: Never Used  Substance and Sexual Activity   Alcohol use: Yes    Alcohol/week: 2.0 standard drinks of alcohol    Types: 1 Cans of beer, 1 Standard drinks or equivalent per week    Comment: weekly   Drug use: No   Sexual activity: Yes  Other  Topics Concern   Not on file  Social History Narrative   Not on file   Social Drivers of Health   Financial Resource Strain: Low Risk  (10/05/2023)   Overall Financial Resource Strain (CARDIA)    Difficulty of Paying Living Expenses: Not hard at all  Food Insecurity: No Food Insecurity (10/05/2023)   Hunger Vital Sign    Worried About Running Out of Food in the Last Year: Never true    Ran Out of Food in the Last Year: Never true  Transportation Needs: No Transportation Needs (10/05/2023)   PRAPARE - Administrator, Civil Service (Medical): No    Lack of Transportation (Non-Medical): No  Physical Activity: Sufficiently Active (10/05/2023)   Exercise Vital Sign    Days of Exercise per Week: 5 days    Minutes of Exercise per Session: 30 min  Stress: No Stress Concern Present (10/05/2023)   [employer] of Occupational Health - Occupational Stress Questionnaire    Feeling of Stress : Not at all  Social Connections: Socially Integrated (10/05/2023)   Social Connection and Isolation Panel [NHANES]    Frequency of Communication with Friends and Family: Twice a week    Frequency of Social Gatherings with Friends and Family: Once a week    Attends Religious Services: More than 4 times per year    Active Member of [employer] or Organizations: Yes    Attends Engineer, structural: More than 4 times per year    Marital Status: Married    Objective:  BP 122/80   Pulse 86   Temp 98.2 F (36.8 C)   Ht 5\' 8"  (1.727 m)   Wt 186 lb (84.4 kg)   SpO2 97%   BMI 28.28 kg/m      04/04/2024    8:25 AM 10/05/2023    8:35 AM 03/19/2023   11:21 AM  BP/Weight  Systolic BP 122 110 98  Diastolic BP 80 62 80  Wt. (Lbs) 186 188.2 194  BMI 28.28 kg/m2 28.62 kg/m2 29.5 kg/m2    Physical Exam Vitals reviewed.  Constitutional:      Appearance: Normal appearance.  Neck:     Vascular: No carotid bruit.  Cardiovascular:     Rate and Rhythm: Normal rate and regular rhythm.      Heart sounds: Normal heart sounds.  Pulmonary:     Effort: Pulmonary effort is normal.     Breath sounds: Normal breath sounds. No wheezing, rhonchi or rales.  Abdominal:     General: Bowel sounds are normal.     Palpations: Abdomen is soft.     Tenderness: There is no abdominal tenderness.  Neurological:     Mental Status: He is alert and oriented to person, place, and time.  Psychiatric:        Mood and Affect: Mood normal.        Behavior: Behavior normal.     Diabetic Foot Exam - Simple   No data filed      Lab Results  Component Value Date   WBC 5.2 10/20/2023   HGB 15.4 10/20/2023   HCT 44.9 10/20/2023   PLT 235 10/20/2023   GLUCOSE 106 (H) 10/20/2023   CHOL 184 10/20/2023   TRIG 149 10/20/2023   HDL 44 10/20/2023   LDLCALC 114 (H) 10/20/2023   ALT 43 10/20/2023   AST 23 10/20/2023   NA 141 10/20/2023   K 4.7 10/20/2023   CL 103 10/20/2023   CREATININE 0.98 10/20/2023   BUN 13 10/20/2023   CO2 21 10/20/2023   TSH 3.600 10/24/2021   HGBA1C 5.3 10/20/2023      Assessment & Plan:   Mixed hyperlipidemia Assessment & Plan: Well controlled.  Continue to work on eating a healthy diet and exercise.  The current medical regimen is effective;  continue present plan with Zetia 10mg , Pravastatin 40mg , and lovaza.     Orders: -     CBC with Differential/Platelet -     Comprehensive metabolic panel with GFR -     Lipid panel  Mild bipolar II disorder, most recent episode major depressive (HCC) Assessment & Plan: The current medical regimen is effective;  continue present plan and medications. Continue Lamictal 150 mg 1 tablet daily, Duloxetine 20 mg capsule 2 daily, and Buspirone 15 mg 1 tablet three daily. I agreed to take over management.   Gastroesophageal reflux disease without esophagitis Assessment & Plan: Controlled on omeprazole once daily   OSA (obstructive sleep apnea) Assessment & Plan: Noncompliant with cpap as does not feel it  helps and causes more problems sleeping.    Encounter for general adult medical examination with abnormal findings -     Montelukast Sodium; Take 1 tablet (10 mg total) by mouth at bedtime.  Dispense: 90 tablet; Refill: 3  Gastroesophageal reflux disease, unspecified whether esophagitis present Assessment & Plan: Controlled on omeprazole once daily  Orders: -     Omeprazole; Take 1 capsule (20 mg total) by mouth daily.  Dispense: 90 capsule; Refill: 3  Other orders -     Ezetimibe; TAKE 1 TABLET BY MOUTH ONCE DAILY.  Dispense: 90 tablet; Refill: 3 -     Ibuprofen; Take 1 tablet (800 mg total) by mouth every 8 (eight) hours as needed.  Dispense: 90 tablet; Refill: 0 -     Omega-3-acid Ethyl Esters; Take 2 capsules (2 g total) by mouth 2 (two) times daily.  Dispense: 360 capsule; Refill: 0     Meds ordered this encounter  Medications   ezetimibe (ZETIA) 10 MG tablet    Sig: TAKE 1 TABLET BY MOUTH ONCE DAILY.    Dispense:  90 tablet    Refill:  3   ibuprofen (ADVIL) 800 MG tablet    Sig: Take 1 tablet (800 mg total) by mouth every 8 (eight) hours as needed.  Dispense:  90 tablet    Refill:  0   montelukast (SINGULAIR) 10 MG tablet    Sig: Take 1 tablet (10 mg total) by mouth at bedtime.    Dispense:  90 tablet    Refill:  3   omega-3 acid ethyl esters (LOVAZA) 1 g capsule    Sig: Take 2 capsules (2 g total) by mouth 2 (two) times daily.    Dispense:  360 capsule    Refill:  0   omeprazole (PRILOSEC) 20 MG capsule    Sig: Take 1 capsule (20 mg total) by mouth daily.    Dispense:  90 capsule    Refill:  3    Orders Placed This Encounter  Procedures   CBC with Differential/Platelet   Comprehensive metabolic panel with GFR   Lipid panel     Follow-up: Return in about 6 months (around 10/04/2024) for cpe.   I,Marla I Leal-Borjas,acting as a scribe for Mercy Stall, MD.,have documented all relevant documentation on the behalf of Mercy Stall, MD,as directed by  Mercy Stall, MD while in the presence of Mercy Stall, MD.   An After Visit Summary was printed and given to the patient.  I attest that I have reviewed this visit and agree with the plan scribed by my staff.   Mercy Stall, MD [facility] [phone]

## 2024-04-04 ENCOUNTER — Encounter: Payer: Self-pay | Admitting: Family Medicine

## 2024-04-04 ENCOUNTER — Ambulatory Visit: Payer: Self-pay | Admitting: Family Medicine

## 2024-04-04 VITALS — BP 122/80 | HR 86 | Temp 98.2°F | Ht 68.0 in | Wt 186.0 lb

## 2024-04-04 DIAGNOSIS — G4733 Obstructive sleep apnea (adult) (pediatric): Secondary | ICD-10-CM

## 2024-04-04 DIAGNOSIS — K219 Gastro-esophageal reflux disease without esophagitis: Secondary | ICD-10-CM

## 2024-04-04 DIAGNOSIS — Z0001 Encounter for general adult medical examination with abnormal findings: Secondary | ICD-10-CM

## 2024-04-04 DIAGNOSIS — F3181 Bipolar II disorder: Secondary | ICD-10-CM

## 2024-04-04 DIAGNOSIS — E782 Mixed hyperlipidemia: Secondary | ICD-10-CM

## 2024-04-04 MED ORDER — IBUPROFEN 800 MG PO TABS: 800.0000 mg | ORAL_TABLET | Freq: Three times a day (TID) | ORAL | 0 refills | Status: DC | PRN

## 2024-04-04 MED ORDER — OMEGA-3-ACID ETHYL ESTERS 1 G PO CAPS: 2.0000 | ORAL_CAPSULE | Freq: Two times a day (BID) | ORAL | 0 refills | Status: DC

## 2024-04-04 MED ORDER — MONTELUKAST SODIUM 10 MG PO TABS: 10.0000 mg | ORAL_TABLET | Freq: Every day | ORAL | 3 refills | Status: DC

## 2024-04-04 MED ORDER — OMEPRAZOLE 20 MG PO CPDR: 20.0000 mg | DELAYED_RELEASE_CAPSULE | Freq: Every day | ORAL | 3 refills | Status: AC

## 2024-04-04 MED ORDER — EZETIMIBE 10 MG PO TABS: ORAL_TABLET | ORAL | 3 refills | Status: AC

## 2024-04-04 NOTE — Assessment & Plan Note (Signed)
 Controlled on omeprazole once daily

## 2024-04-04 NOTE — Assessment & Plan Note (Signed)
 The current medical regimen is effective;  continue present plan and medications. Continue Lamictal 150 mg 1 tablet daily, Duloxetine 20 mg capsule 2 daily, and Buspirone 15 mg 1 tablet three daily. I agreed to take over management.

## 2024-04-04 NOTE — Assessment & Plan Note (Signed)
 Well controlled.  Continue to work on eating a healthy diet and exercise.  The current medical regimen is effective;  continue present plan with Zetia 10mg , Pravastatin 40mg , and lovaza.

## 2024-04-04 NOTE — Assessment & Plan Note (Signed)
 Noncompliant with cpap as does not feel it helps and causes more problems sleeping.

## 2024-04-05 LAB — LIPID PANEL
Chol/HDL Ratio: 4.9 ratio (ref 0.0–5.0)
Cholesterol, Total: 221 mg/dL — ABNORMAL HIGH (ref 100–199)
HDL: 45 mg/dL (ref >39)
LDL Chol Calc (NIH): 128 mg/dL — ABNORMAL HIGH (ref 0–99)
Triglycerides: 272 mg/dL — ABNORMAL HIGH (ref 0–149)
VLDL Cholesterol Cal: 48 mg/dL — ABNORMAL HIGH (ref 5–40)

## 2024-04-05 LAB — CBC WITH DIFFERENTIAL/PLATELET
Basophils Absolute: 0 10*3/uL (ref 0.0–0.2)
Basos: 0 %
EOS (ABSOLUTE): 0.2 10*3/uL (ref 0.0–0.4)
Eos: 3 %
Hematocrit: 47 % (ref 37.5–51.0)
Hemoglobin: 16 g/dL (ref 13.0–17.7)
Immature Grans (Abs): 0 10*3/uL (ref 0.0–0.1)
Immature Granulocytes: 0 %
Lymphocytes Absolute: 2.7 10*3/uL (ref 0.7–3.1)
Lymphs: 51 %
MCH: 30.8 pg (ref 26.6–33.0)
MCHC: 34 g/dL (ref 31.5–35.7)
MCV: 91 fL (ref 79–97)
Monocytes Absolute: 0.3 10*3/uL (ref 0.1–0.9)
Monocytes: 6 %
Neutrophils Absolute: 2.1 10*3/uL (ref 1.4–7.0)
Neutrophils: 40 %
Platelets: 235 10*3/uL (ref 150–450)
RBC: 5.19 x10E6/uL (ref 4.14–5.80)
RDW: 12.5 % (ref 11.6–15.4)
WBC: 5.3 10*3/uL (ref 3.4–10.8)

## 2024-04-05 LAB — COMPREHENSIVE METABOLIC PANEL WITH GFR
ALT: 22 IU/L (ref 0–44)
AST: 20 IU/L (ref 0–40)
Albumin: 4.8 g/dL (ref 4.1–5.1)
Alkaline Phosphatase: 92 IU/L (ref 44–121)
BUN/Creatinine Ratio: 14 (ref 9–20)
BUN: 14 mg/dL (ref 6–24)
Bilirubin Total: 0.4 mg/dL (ref 0.0–1.2)
CO2: 24 mmol/L (ref 20–29)
Calcium: 10 mg/dL (ref 8.7–10.2)
Chloride: 103 mmol/L (ref 96–106)
Creatinine, Ser: 0.99 mg/dL (ref 0.76–1.27)
Globulin, Total: 2.3 g/dL (ref 1.5–4.5)
Glucose: 95 mg/dL (ref 70–99)
Potassium: 4.5 mmol/L (ref 3.5–5.2)
Sodium: 142 mmol/L (ref 134–144)
Total Protein: 7.1 g/dL (ref 6.0–8.5)
eGFR: 98 mL/min/{1.73_m2} (ref >59)

## 2024-04-06 ENCOUNTER — Encounter: Payer: Self-pay | Admitting: Family Medicine

## 2024-05-10 ENCOUNTER — Other Ambulatory Visit: Payer: Self-pay | Admitting: Family Medicine

## 2024-05-10 DIAGNOSIS — E782 Mixed hyperlipidemia: Secondary | ICD-10-CM

## 2024-07-05 ENCOUNTER — Other Ambulatory Visit: Payer: Self-pay | Admitting: Family Medicine

## 2024-07-05 DIAGNOSIS — E782 Mixed hyperlipidemia: Secondary | ICD-10-CM

## 2024-07-17 ENCOUNTER — Other Ambulatory Visit: Payer: Self-pay | Admitting: Family Medicine

## 2024-08-02 ENCOUNTER — Encounter: Payer: Self-pay | Admitting: Family Medicine

## 2024-08-02 ENCOUNTER — Other Ambulatory Visit: Payer: Self-pay | Admitting: Family Medicine

## 2024-08-02 DIAGNOSIS — E782 Mixed hyperlipidemia: Secondary | ICD-10-CM

## 2024-08-02 DIAGNOSIS — F3181 Bipolar II disorder: Secondary | ICD-10-CM

## 2024-08-02 DIAGNOSIS — Z0001 Encounter for general adult medical examination with abnormal findings: Secondary | ICD-10-CM

## 2024-08-02 MED ORDER — LAMOTRIGINE 150 MG PO TABS: 150.0000 mg | ORAL_TABLET | Freq: Every day | ORAL | 0 refills | Status: DC

## 2024-08-02 MED ORDER — BUSPIRONE HCL 15 MG PO TABS: 15.0000 mg | ORAL_TABLET | Freq: Three times a day (TID) | ORAL | 0 refills | Status: DC

## 2024-08-02 MED ORDER — MONTELUKAST SODIUM 10 MG PO TABS: 10.0000 mg | ORAL_TABLET | Freq: Every day | ORAL | 3 refills | Status: AC

## 2024-08-02 MED ORDER — DULOXETINE HCL 20 MG PO CPEP: 40.0000 mg | ORAL_CAPSULE | Freq: Every day | ORAL | 0 refills | Status: DC

## 2024-08-02 MED ORDER — OMEGA-3-ACID ETHYL ESTERS 1 G PO CAPS: 2.0000 | ORAL_CAPSULE | Freq: Two times a day (BID) | ORAL | 0 refills | Status: DC

## 2024-08-02 MED ORDER — PRAVASTATIN SODIUM 40 MG PO TABS: 40.0000 mg | ORAL_TABLET | Freq: Every day | ORAL | 0 refills | Status: DC

## 2024-08-03 MED ORDER — DULOXETINE HCL 60 MG PO CPEP: 60.0000 mg | ORAL_CAPSULE | Freq: Every day | ORAL | 1 refills | Status: DC

## 2024-08-03 NOTE — Telephone Encounter (Unsigned)
 Copied from CRM [id number]. Topic: Clinical - Prescription Issue >> Aug 03, 2024  8:26 AM Charlet HERO wrote: Reason for CRM: Patients wife Susana is calling to ck on the refil for med he is stating that one of them wrong he gave the wrong mg. DULoxetine  (CYMBALTA ) 20 MG capsule she is stating that it is suppose 60 mg

## 2024-10-05 ENCOUNTER — Ambulatory Visit (INDEPENDENT_AMBULATORY_CARE_PROVIDER_SITE_OTHER): Payer: Self-pay | Admitting: Family Medicine

## 2024-10-05 ENCOUNTER — Encounter: Payer: Self-pay | Admitting: Family Medicine

## 2024-10-05 VITALS — BP 128/62 | HR 79 | Temp 98.2°F | Ht 68.0 in | Wt 184.0 lb

## 2024-10-05 DIAGNOSIS — Z6827 Body mass index (BMI) 27.0-27.9, adult: Secondary | ICD-10-CM

## 2024-10-05 DIAGNOSIS — E663 Overweight: Secondary | ICD-10-CM

## 2024-10-05 DIAGNOSIS — K219 Gastro-esophageal reflux disease without esophagitis: Secondary | ICD-10-CM

## 2024-10-05 DIAGNOSIS — F3181 Bipolar II disorder: Secondary | ICD-10-CM

## 2024-10-05 DIAGNOSIS — E782 Mixed hyperlipidemia: Secondary | ICD-10-CM

## 2024-10-05 DIAGNOSIS — Z0000 Encounter for general adult medical examination without abnormal findings: Secondary | ICD-10-CM

## 2024-10-05 LAB — POCT LIPID PANEL
HDL: 46
LDL: 68
Non-HDL: 88
TC: 134
TRG: 99

## 2024-10-05 MED ORDER — DULOXETINE HCL 60 MG PO CPEP
60.0000 mg | ORAL_CAPSULE | Freq: Every day | ORAL | 2 refills | Status: AC
Start: 2024-10-05 — End: ?

## 2024-10-05 MED ORDER — OMEGA-3-ACID ETHYL ESTERS 1 G PO CAPS: 2.0000 | ORAL_CAPSULE | Freq: Two times a day (BID) | ORAL | 2 refills | Status: AC

## 2024-10-05 MED ORDER — PRAVASTATIN SODIUM 40 MG PO TABS: 40.0000 mg | ORAL_TABLET | Freq: Every day | ORAL | 2 refills | Status: AC

## 2024-10-05 MED ORDER — IBUPROFEN 800 MG PO TABS: 800.0000 mg | ORAL_TABLET | Freq: Three times a day (TID) | ORAL | 2 refills | Status: AC | PRN

## 2024-10-05 MED ORDER — BUSPIRONE HCL 15 MG PO TABS: 15.0000 mg | ORAL_TABLET | Freq: Three times a day (TID) | ORAL | 2 refills | Status: AC

## 2024-10-05 MED ORDER — LAMOTRIGINE 150 MG PO TABS: 150.0000 mg | ORAL_TABLET | Freq: Every day | ORAL | 2 refills | Status: AC

## 2024-10-05 NOTE — Assessment & Plan Note (Addendum)
 Well-controlled with omeprazole . Weight loss may allow medication discontinuation. - Continue omeprazole .

## 2024-10-05 NOTE — Progress Notes (Signed)
 Subjective:  Patient ID: Matthew Wilcox, male    DOB: October 22, 1981  Age: 43 y.o. MRN: [id number]  Chief Complaint  Patient presents with   Annual Exam    HPI: Discussed the use of AI scribe software for clinical note transcription with the patient, who gave verbal consent to proceed.  History of Present Illness Matthew Wilcox is a 43 year old male who presents for a routine follow-up regarding his cholesterol management.  Hyperlipidemia management - Currently taking Lovaza  two capsules twice daily, pravastatin  40 mg once daily, and Zetia  10 mg once daily - Actively working on dietary modifications including healthy eating and reduced sugar intake - Engaging in regular exercise - Weight loss of approximately ten pounds over the past year - Increased water intake  Upper respiratory and gastrointestinal symptoms - Stuffy nose and stomach ache yesterday - No diarrhea - No fever, chills, sweats, earaches, sore throat, or chest pain - Difficulty catching breath yesterday - Morning cough only  BIpolar - Taking Cymbalta  60 mg daily, buspirone  15 mg three times daily, and Lamictal  150 mg once daily for mood stabilization  Gastroesophageal reflux disease - Taking omeprazole  for acid reflux - No current symptoms of acid reflux  Allergic rhinitis - Taking Singulair  for allergies  Musculoskeletal symptoms - No current issues with toes after discontinuing lamisil   Patient wears a seatbelt.  His home has carbon oxide and smoke detectors.  He has no guns in his home.  He sees an eye doctor on a regular basis as well as a Education officer, community.  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  Denies any erectile dysfunction.       04/04/2024    8:37 AM 10/05/2023    8:44 AM 08/06/2022    3:07 PM 10/24/2021    9:19 PM 09/09/2021    3:48 PM  Depression screen PHQ 2/9  Decreased Interest 0 0 0 0 0  Down, Depressed, Hopeless 0 0 0 0 0  PHQ - 2 Score 0 0 0 0 0  Altered sleeping 3 2 1     Tired,  decreased energy 0 1 0    Change in appetite 0 0 0    Feeling bad or failure about yourself  0 0 0    Trouble concentrating 0 0 0    Moving slowly or fidgety/restless 1 0 0    Suicidal thoughts 0 0 0    PHQ-9 Score 4 3 1     Difficult doing work/chores Not difficult at all  Not difficult at all          04/04/2024    8:27 AM  Fall Risk   Falls in the past year? 0  Number falls in past yr: 0  Injury with Fall? 0  Risk for fall due to : No Fall Risks  Follow up Falls evaluation completed    Patient Care Team: Sherre Clapper, MD as PCP - General (Family Medicine)   Review of Systems  Constitutional:  Negative for chills, fatigue and fever.  HENT:  Positive for congestion. Negative for ear pain and sore throat.   Respiratory:  Negative for cough and shortness of breath.   Cardiovascular:  Negative for chest pain.  Gastrointestinal:  Negative for abdominal pain, constipation, diarrhea, nausea and vomiting.  Endocrine: Negative for polydipsia, polyphagia and polyuria.  Genitourinary:  Negative for dysuria and frequency.  Musculoskeletal:  Negative for arthralgias and myalgias.  Neurological:  Negative for dizziness and headaches.  Psychiatric/Behavioral:  Negative for dysphoric  mood.        No dysphoria    Current Outpatient Medications on File Prior to Visit  Medication Sig Dispense Refill   busPIRone  (BUSPAR ) 15 MG tablet Take 1 tablet (15 mg total) by mouth 3 (three) times daily. 270 tablet 0   DULoxetine  (CYMBALTA ) 60 MG capsule Take 1 capsule (60 mg total) by mouth daily. 90 capsule 1   ezetimibe  (ZETIA ) 10 MG tablet TAKE 1 TABLET BY MOUTH ONCE DAILY. 90 tablet 3   ibuprofen  (ADVIL ) 800 MG tablet Take 1 tablet (800 mg total) by mouth every 8 (eight) hours as needed. 90 tablet 0   lamoTRIgine  (LAMICTAL ) 150 MG tablet Take 1 tablet (150 mg total) by mouth at bedtime. 90 tablet 0   montelukast  (SINGULAIR ) 10 MG tablet Take 1 tablet (10 mg total) by mouth at bedtime. 90 tablet 3    omega-3 acid ethyl esters (LOVAZA ) 1 g capsule Take 2 capsules (2 g total) by mouth 2 (two) times daily. 360 capsule 0   omeprazole  (PRILOSEC) 20 MG capsule Take 1 capsule (20 mg total) by mouth daily. 90 capsule 3   pravastatin  (PRAVACHOL ) 40 MG tablet Take 1 tablet (40 mg total) by mouth daily. 90 tablet 0   No current facility-administered medications on file prior to visit.   Past Medical History:  Diagnosis Date   Bipolar disorder (HCC)    GAD (generalized anxiety disorder)    Insomnia due to mental disorder    Mixed hyperlipidemia    Other speech disturbances    Past Surgical History:  Procedure Laterality Date   NO PAST SURGERIES      Family History  Problem Relation Age of Onset   Depression Mother    Cancer Father        prostate   Depression Father    Social History   Socioeconomic History   Marital status: Married    Spouse name: Not on file   Number of children: Not on file   Years of education: Not on file   Highest education level: 12th grade  Occupational History   Occupation: Education administrator  Tobacco Use   Smoking status: Never   Smokeless tobacco: Never  Vaping Use   Vaping status: Never Used  Substance and Sexual Activity   Alcohol use: Yes    Alcohol/week: 2.0 standard drinks of alcohol    Types: 1 Cans of beer, 1 Standard drinks or equivalent per week    Comment: weekly   Drug use: No   Sexual activity: Yes  Other Topics Concern   Not on file  Social History Narrative   Not on file   Social Drivers of Health   Financial Resource Strain: Low Risk  (10/01/2024)   Overall Financial Resource Strain (CARDIA)    Difficulty of Paying Living Expenses: Not hard at all  Food Insecurity: No Food Insecurity (10/01/2024)   Hunger Vital Sign    Worried About Running Out of Food in the Last Year: Never true    Ran Out of Food in the Last Year: Never true  Transportation Needs: No Transportation Needs (10/01/2024)   PRAPARE - Scientist, research (physical sciences) (Medical): No    Lack of Transportation (Non-Medical): No  Physical Activity: Insufficiently Active (10/01/2024)   Exercise Vital Sign    Days of Exercise per Week: 1 day    Minutes of Exercise per Session: 10 min  Stress: Stress Concern Present (10/01/2024)   [employer] of Occupational Health -  Occupational Stress Questionnaire    Feeling of Stress: To some extent  Social Connections: Socially Integrated (10/01/2024)   Social Connection and Isolation Panel    Frequency of Communication with Friends and Family: Three times a week    Frequency of Social Gatherings with Friends and Family: Twice a week    Attends Religious Services: More than 4 times per year    Active Member of [employer] or Organizations: Yes    Attends Banker Meetings: 1 to 4 times per year    Marital Status: Married    Objective:  BP 128/62   Pulse 79   Temp 98.2 F (36.8 C)   Ht 5' 8 (1.727 m)   Wt 184 lb (83.5 kg)   SpO2 99%   BMI 27.98 kg/m      10/05/2024    8:07 AM 04/04/2024    8:25 AM 10/05/2023    8:35 AM  BP/Weight  Systolic BP 128 122 110  Diastolic BP 62 80 62  Wt. (Lbs) 184 186 188.2  BMI 27.98 kg/m2 28.28 kg/m2 28.62 kg/m2    Physical Exam Vitals reviewed.  Constitutional:      Appearance: Normal appearance.  HENT:     Right Ear: Tympanic membrane normal.     Left Ear: Tympanic membrane normal.     Nose: Nose normal.     Mouth/Throat:     Pharynx: No posterior oropharyngeal erythema.  Neck:     Vascular: No carotid bruit.  Cardiovascular:     Rate and Rhythm: Normal rate and regular rhythm.     Heart sounds: Normal heart sounds.  Pulmonary:     Effort: Pulmonary effort is normal.     Breath sounds: Normal breath sounds. No wheezing, rhonchi or rales.  Abdominal:     General: Bowel sounds are normal.     Palpations: Abdomen is soft.     Tenderness: There is no abdominal tenderness.  Neurological:     Mental Status: He is alert and oriented  to person, place, and time.  Psychiatric:        Mood and Affect: Mood normal.        Behavior: Behavior normal.         Lab Results  Component Value Date   WBC 5.3 04/04/2024   HGB 16.0 04/04/2024   HCT 47.0 04/04/2024   PLT 235 04/04/2024   GLUCOSE 95 04/04/2024   CHOL 221 (H) 04/04/2024   TRIG 272 (H) 04/04/2024   HDL 45 04/04/2024   LDLCALC 128 (H) 04/04/2024   ALT 22 04/04/2024   AST 20 04/04/2024   NA 142 04/04/2024   K 4.5 04/04/2024   CL 103 04/04/2024   CREATININE 0.99 04/04/2024   BUN 14 04/04/2024   CO2 24 04/04/2024   TSH 3.600 10/24/2021   HGBA1C 5.3 10/20/2023    Results for orders placed or performed in visit on 10/05/24  POCT Lipid Panel   Collection Time: 10/05/24  8:21 AM  Result Value Ref Range   TC 134    HDL 46    TRG 99    LDL 68    Non-HDL 88    TC/HDL    .  Assessment & Plan:   Assessment & Plan Mixed hyperlipidemia Managed with Lovaza , pravastatin , and Zetia . Cholesterol at goal  - Continue Lovaza , pravastatin , and Zetia . Orders:   POCT Lipid Panel  General medical exam Healthy male    Mild bipolar II disorder, most recent episode major depressive (  HCC) Well-managed with Cymbalta , buspirone , and Lamictal . - Continue Cymbalta , buspirone , and Lamictal .    Gastroesophageal reflux disease without esophagitis Well-controlled with omeprazole . Weight loss may allow medication discontinuation. - Continue omeprazole .    Overweight with body mass index (BMI) of 27 to 27.9 in adult Encouragement given. Recommend continue to work on eating healthy diet and exercise.       Body mass index is 27.98 kg/m.    No orders of the defined types were placed in this encounter.   Orders Placed This Encounter  Procedures   POCT Lipid Panel       Follow-up: Return in about 6 months (around 04/05/2025) for chronic fasting, cpe.  An After Visit Summary was printed and given to the patient.  Abigail Free, MD [facility] [phone]

## 2024-10-05 NOTE — Assessment & Plan Note (Addendum)
 Well-managed with Cymbalta , buspirone , and Lamictal . - Continue Cymbalta , buspirone , and Lamictal .

## 2024-10-05 NOTE — Assessment & Plan Note (Addendum)
 Managed with Lovaza , pravastatin , and Zetia . Cholesterol at goal  - Continue Lovaza , pravastatin , and Zetia . Orders:   POCT Lipid Panel

## 2025-04-05 ENCOUNTER — Ambulatory Visit: Payer: Self-pay | Admitting: Family Medicine
